# Patient Record
Sex: Female | Born: 1980 | Race: Black or African American | Hispanic: No | Marital: Married
Health system: Southern US, Community
[De-identification: ages and names within clinical notes are randomized; demographics above are authoritative.]

## PROBLEM LIST (undated history)

## (undated) ENCOUNTER — Emergency Department (HOSPITAL_COMMUNITY): Admission: EM | Payer: Medicaid Other

## (undated) DIAGNOSIS — Z789 Other specified health status: Secondary | ICD-10-CM

---

## 2021-07-01 ENCOUNTER — Ambulatory Visit (HOSPITAL_COMMUNITY)
Admission: EM | Admit: 2021-07-01 | Discharge: 2021-07-01 | Disposition: A | Discharge status: Home / Self Care | Payer: Medicaid Other | Attending: Family Medicine | Admitting: Family Medicine

## 2021-07-01 ENCOUNTER — Encounter (HOSPITAL_COMMUNITY): Payer: Self-pay

## 2021-07-01 ENCOUNTER — Other Ambulatory Visit: Payer: Self-pay

## 2021-07-01 DIAGNOSIS — Z3490 Encounter for supervision of normal pregnancy, unspecified, unspecified trimester: Secondary | ICD-10-CM

## 2021-07-01 DIAGNOSIS — Z3201 Encounter for pregnancy test, result positive: Secondary | ICD-10-CM

## 2021-07-01 LAB — POC URINE PREG, ED: Preg Test, Ur: POSITIVE — AB

## 2021-07-01 MED ORDER — PRENATAL PLUS VITAMIN/MINERAL 27-1 MG PO TABS: 1.0000 | ORAL_TABLET | Freq: Every day | ORAL | 0 refills | Status: DC

## 2021-07-01 NOTE — Discharge Instructions (Addendum)
Take a prenatal vitamin 1 daily.  Call to make an appointment with the [facility] doctor

## 2021-07-01 NOTE — ED Provider Notes (Signed)
[place]    CSN: [id number] Arrival date & time: 07/01/21  1856      History   Chief Complaint Chief Complaint  Patient presents with   Hypertension    HPI Janice Collins is a 41 y.o. female.    Hypertension  Here for a week history of feeling dizzy and a burning sensation in her head.  She has also had some nausea.  She feels that this is due to high blood pressure.  She was [place].  She is not having any headache.  Last menstrual period was November 2022.  History reviewed. No pertinent past medical history.  There are no problems to display for this patient.   Past Surgical History:  Procedure Laterality Date   CESAREAN SECTION      OB History     Gravida  1   Para      Term      Preterm      AB      Living         SAB      IAB      Ectopic      Multiple      Live Births               Home Medications    Prior to Admission medications   Medication Sig Start Date End Date Taking? Authorizing Provider  Prenatal Vit-Fe Fumarate-FA (PRENATAL PLUS VITAMIN/MINERAL) 27-1 MG TABS Take 1 tablet by mouth daily. 07/01/21  Yes Darral Rishel, Gwenlyn Perking, MD    Family History Family History  Family history unknown: Yes    Social History Social History   Tobacco Use   Smoking status: Never   Smokeless tobacco: Never     Allergies   Patient has no known allergies.   Review of Systems Review of Systems   Physical Exam Triage Vital Signs ED Triage Vitals  Enc Vitals Group     BP 07/01/21 2002 132/79     Pulse Rate 07/01/21 2002 74     Resp 07/01/21 2002 18     Temp 07/01/21 2002 98.8 F (37.1 C)     Temp Source 07/01/21 2002 Oral     SpO2 07/01/21 2002 99 %     Weight --      Height --      Head Circumference --      Peak Flow --      Pain Score 07/01/21 1957 5     Pain Loc --      Pain Edu? --      Excl. in [place]? --    No data found.  Updated Vital Signs BP  132/79 (BP Location: Left Arm)    Pulse 74    Temp 98.8 F (37.1 C) (Oral)    Resp 18    LMP 04/19/2021 Comment: EDD: 01/24/2022   SpO2 99%   Visual Acuity Right Eye Distance:   Left Eye Distance:   Bilateral Distance:    Right Eye Near:   Left Eye Near:    Bilateral Near:     Physical Exam Vitals reviewed.  Constitutional:      General: She is not in acute distress.    Appearance: She is not toxic-appearing.  HENT:     Mouth/Throat:     Mouth: Mucous membranes are moist.  Eyes:     Extraocular Movements: Extraocular movements intact.     Pupils: Pupils are equal,  round, and reactive to light.  Cardiovascular:     Rate and Rhythm: Normal rate and regular rhythm.     Heart sounds: No murmur heard. Pulmonary:     Effort: Pulmonary effort is normal.     Breath sounds: Normal breath sounds.  Musculoskeletal:     Cervical back: Neck supple.  Lymphadenopathy:     Cervical: No cervical adenopathy.  Skin:    Coloration: Skin is not jaundiced or pale.  Neurological:     General: No focal deficit present.     Mental Status: She is alert.  Psychiatric:        Behavior: Behavior normal.     UC Treatments / Results  Labs (all labs ordered are listed, but only abnormal results are displayed) Labs Reviewed  POC URINE PREG, ED - Abnormal; Notable for the following components:      Result Value   Preg Test, Ur POSITIVE (*)    All other components within normal limits    EKG   Radiology No results found.  Procedures Procedures (including critical care time)  Medications Ordered in UC Medications - No data to display  Initial Impression / Assessment and Plan / UC Course  I have reviewed the triage vital signs and the nursing notes.  Pertinent labs & imaging results that were available during my care of the patient were reviewed by me and considered in my medical decision making (see chart for details).     UPT is Positive. Her bp is normal. Discussed possibility  that these symptoms may be related to early pregnancy Final Clinical Impressions(s) / UC Diagnoses   Final diagnoses:  Early stage of pregnancy   Discharge Instructions   None    ED Prescriptions     Medication Sig Dispense Auth. Provider   Prenatal Vit-Fe Fumarate-FA (PRENATAL PLUS VITAMIN/MINERAL) 27-1 MG TABS Take 1 tablet by mouth daily. 30 tablet Azriel Jakob, Gwenlyn Perking, MD      PDMP not reviewed this encounter.   Barrett Henle, MD 07/01/21 2040

## 2021-07-01 NOTE — ED Triage Notes (Signed)
Pt presents with elevated blood pressure X 1 week with some dizziness and nausea; pt states she used to be on medication for it when she was in [place]

## 2021-08-09 ENCOUNTER — Other Ambulatory Visit: Payer: Self-pay

## 2021-08-09 ENCOUNTER — Inpatient Hospital Stay (HOSPITAL_COMMUNITY): Payer: Medicaid Other

## 2021-08-09 ENCOUNTER — Telehealth: Payer: Self-pay | Admitting: Family Medicine

## 2021-08-09 ENCOUNTER — Encounter (HOSPITAL_COMMUNITY): Payer: Self-pay

## 2021-08-09 ENCOUNTER — Inpatient Hospital Stay (HOSPITAL_COMMUNITY)
Admission: EM | Admit: 2021-08-09 | Discharge: 2021-08-09 | Disposition: A | Discharge status: Home / Self Care | Payer: Medicaid Other | Attending: Obstetrics and Gynecology | Admitting: Obstetrics and Gynecology

## 2021-08-09 DIAGNOSIS — O036 Delayed or excessive hemorrhage following complete or unspecified spontaneous abortion: Secondary | ICD-10-CM | POA: Insufficient documentation

## 2021-08-09 DIAGNOSIS — Z6740 Type O blood, Rh positive: Secondary | ICD-10-CM | POA: Diagnosis not present

## 2021-08-09 DIAGNOSIS — O209 Hemorrhage in early pregnancy, unspecified: Secondary | ICD-10-CM

## 2021-08-09 DIAGNOSIS — M549 Dorsalgia, unspecified: Secondary | ICD-10-CM | POA: Insufficient documentation

## 2021-08-09 DIAGNOSIS — O039 Complete or unspecified spontaneous abortion without complication: Secondary | ICD-10-CM | POA: Diagnosis not present

## 2021-08-09 DIAGNOSIS — O0932 Supervision of pregnancy with insufficient antenatal care, second trimester: Secondary | ICD-10-CM | POA: Diagnosis not present

## 2021-08-09 DIAGNOSIS — O26892 Other specified pregnancy related conditions, second trimester: Secondary | ICD-10-CM | POA: Diagnosis not present

## 2021-08-09 DIAGNOSIS — N939 Abnormal uterine and vaginal bleeding, unspecified: Secondary | ICD-10-CM | POA: Diagnosis not present

## 2021-08-09 DIAGNOSIS — R1030 Lower abdominal pain, unspecified: Secondary | ICD-10-CM | POA: Insufficient documentation

## 2021-08-09 DIAGNOSIS — O0942 Supervision of pregnancy with grand multiparity, second trimester: Secondary | ICD-10-CM | POA: Diagnosis not present

## 2021-08-09 DIAGNOSIS — O09522 Supervision of elderly multigravida, second trimester: Secondary | ICD-10-CM | POA: Diagnosis not present

## 2021-08-09 DIAGNOSIS — Z3A16 16 weeks gestation of pregnancy: Secondary | ICD-10-CM | POA: Diagnosis not present

## 2021-08-09 DIAGNOSIS — O26899 Other specified pregnancy related conditions, unspecified trimester: Secondary | ICD-10-CM

## 2021-08-09 HISTORY — DX: Other specified health status: Z78.9

## 2021-08-09 LAB — CBC WITH DIFFERENTIAL/PLATELET
Abs Immature Granulocytes: 0 10*3/uL (ref 0.00–0.07)
Basophils Absolute: 0 10*3/uL (ref 0.0–0.1)
Basophils Relative: 0 %
Eosinophils Absolute: 0.1 10*3/uL (ref 0.0–0.5)
Eosinophils Relative: 3 %
HCT: 33 % — ABNORMAL LOW (ref 36.0–46.0)
Hemoglobin: 10.3 g/dL — ABNORMAL LOW (ref 12.0–15.0)
Immature Granulocytes: 0 %
Lymphocytes Relative: 47 %
Lymphs Abs: 2 10*3/uL (ref 0.7–4.0)
MCH: 26.1 pg (ref 26.0–34.0)
MCHC: 31.2 g/dL (ref 30.0–36.0)
MCV: 83.5 fL (ref 80.0–100.0)
Monocytes Absolute: 0.3 10*3/uL (ref 0.1–1.0)
Monocytes Relative: 8 %
Neutro Abs: 1.7 10*3/uL (ref 1.7–7.7)
Neutrophils Relative %: 42 %
Platelets: 199 10*3/uL (ref 150–400)
RBC: 3.95 MIL/uL (ref 3.87–5.11)
RDW: 13.2 % (ref 11.5–15.5)
WBC: 4.1 10*3/uL (ref 4.0–10.5)
nRBC: 0 % (ref 0.0–0.2)

## 2021-08-09 LAB — WET PREP, GENITAL
Clue Cells Wet Prep HPF POC: NONE SEEN
Sperm: NONE SEEN
Trich, Wet Prep: NONE SEEN
WBC, Wet Prep HPF POC: <10 (ref <10)

## 2021-08-09 LAB — URINALYSIS, ROUTINE W REFLEX MICROSCOPIC
Bacteria, UA: NONE SEEN
Bilirubin Urine: NEGATIVE
Glucose, UA: NEGATIVE mg/dL
Ketones, ur: NEGATIVE mg/dL
Leukocytes,Ua: NEGATIVE
Nitrite: NEGATIVE
Protein, ur: NEGATIVE mg/dL
Specific Gravity, Urine: 1.012 (ref 1.005–1.030)
pH: 8 (ref 5.0–8.0)

## 2021-08-09 LAB — COMPREHENSIVE METABOLIC PANEL
ALT: 12 U/L (ref 0–44)
AST: 18 U/L (ref 15–41)
Albumin: 4.3 g/dL (ref 3.5–5.0)
Alkaline Phosphatase: 60 U/L (ref 38–126)
Anion gap: 8 (ref 5–15)
BUN: 8 mg/dL (ref 6–20)
CO2: 24 mmol/L (ref 22–32)
Calcium: 9.3 mg/dL (ref 8.9–10.3)
Chloride: 104 mmol/L (ref 98–111)
Creatinine, Ser: 0.48 mg/dL (ref 0.44–1.00)
GFR, Estimated: >60 mL/min (ref >60)
Glucose, Bld: 103 mg/dL — ABNORMAL HIGH (ref 70–99)
Potassium: 3.7 mmol/L (ref 3.5–5.1)
Sodium: 136 mmol/L (ref 135–145)
Total Bilirubin: 0.6 mg/dL (ref 0.3–1.2)
Total Protein: 8.3 g/dL — ABNORMAL HIGH (ref 6.5–8.1)

## 2021-08-09 LAB — GC/CHLAMYDIA PROBE AMP (~~LOC~~) NOT AT ARMC
Chlamydia: NEGATIVE
Comment: NEGATIVE
Comment: NORMAL
Neisseria Gonorrhea: NEGATIVE

## 2021-08-09 LAB — HCG, QUANTITATIVE, PREGNANCY: hCG, Beta Chain, Quant, S: 14834 m[IU]/mL — ABNORMAL HIGH (ref <5)

## 2021-08-09 LAB — ABO/RH: ABO/RH(D): O POS

## 2021-08-09 MED ORDER — ACETAMINOPHEN 500 MG PO TABS
1000.0000 mg | ORAL_TABLET | Freq: Once | ORAL | Status: AC
  Administered 2021-08-09: 1000 mg via ORAL
  Filled 2021-08-09: qty 2

## 2021-08-09 MED ORDER — TERCONAZOLE 0.4 % VA CREA: 1.0000 | TOPICAL_CREAM | Freq: Every day | VAGINAL | 0 refills | Status: DC

## 2021-08-09 NOTE — Telephone Encounter (Signed)
Called patient to inform of appointment in 2 weeks with the help of an interpreter, there was no answer to the phone call and the option to leave a voicemail was no available. A letter was mailed out to the patient.

## 2021-08-09 NOTE — ED Triage Notes (Signed)
Pt is G9P8, 3 months pregnant and began to have vaginal bleeding today, does not have an OB doctor here, recently moved here 4 months ago

## 2021-08-09 NOTE — MAU Provider Note (Signed)
[place]     CSN: [id number]  Arrival date and time: 08/09/21 0030   None     Chief Complaint  Patient presents with   Vaginal Bleeding   HPI Janice Collins is a 41 y.o G9P8 at [redacted]w[redacted]d by LMP who presents to MAU for vaginal bleeding, lower abdominal and back pain. Patient reports around 11pm tonight while cooking she had a small gush of bright red blood. Since then, she has not had any more bleeding. She denies passing clots or tissue. Shortly after the bleeding occurred she started having lower abdominal and back pain. She reports pain is intermittent and mild. She has not tried anything to relieve the pain. She denies urinary s/s, nausea, vomiting, diarrhea, or constipation. Sure LMP was 05/03/2021. She has not yet established prenatal care.   OB History     Gravida  9   Para  8   Term  8   Preterm      AB      Living  8      SAB      IAB      Ectopic      Multiple      Live Births  8           Past Medical History:  Diagnosis Date   Medical history non-contributory     Past Surgical History:  Procedure Laterality Date   CESAREAN SECTION      Family History  Family history unknown: Yes    Social History   Tobacco Use   Smoking status: Never   Smokeless tobacco: Never  Substance Use Topics   Alcohol use: Never   Drug use: Never    Allergies: No Known Allergies  Medications Prior to Admission  Medication Sig Dispense Refill Last Dose   Prenatal Vit-Fe Fumarate-FA (PRENATAL PLUS VITAMIN/MINERAL) 27-1 MG TABS Take 1 tablet by mouth daily. 30 tablet 0 08/09/2021    Review of Systems  Constitutional: Negative.   Respiratory: Negative.    Cardiovascular: Negative.   Gastrointestinal:  Positive for abdominal pain. Negative for constipation, diarrhea, nausea and vomiting.  Genitourinary:  Positive for vaginal bleeding. Negative for dysuria and frequency.  Musculoskeletal:  Positive for back pain.  Neurological: Negative.    Physical  Exam   Blood pressure 122/65, pulse 65, temperature 99.4 F (37.4 C), temperature source Oral, resp. rate 16, height 5\' 5"  (1.651 m), weight 72.1 kg, last menstrual period 04/19/2021, SpO2 100 %.  Physical Exam Vitals and nursing note reviewed. Exam conducted with a chaperone present.  Constitutional:      General: She is not in acute distress. Eyes:     Extraocular Movements: Extraocular movements intact.     Pupils: Pupils are equal, round, and reactive to light.  Cardiovascular:     Rate and Rhythm: Normal rate.  Pulmonary:     Effort: Pulmonary effort is normal.  Abdominal:     Palpations: Abdomen is soft.     Tenderness: There is no abdominal tenderness.  Genitourinary:    Comments: Blind swabs collected. Small amount of pink discharge noted on swabs Musculoskeletal:        General: Normal range of motion.     Cervical back: Normal range of motion.  Skin:    General: Skin is warm and dry.  Neurological:     General: No focal deficit present.     Mental Status: She is alert and oriented to person, place, and time.  Psychiatric:  Mood and Affect: Mood normal.        Behavior: Behavior normal.        Thought Content: Thought content normal.        Judgment: Judgment normal.   US OB LESS THAN 14 WEEKS WITH OB TRANSVAGINAL  Result Date: 08/09/2021 CLINICAL DATA:  Bleeding. EXAM: OBSTETRIC <14 WK [place] AND TRANSVAGINAL OB US TECHNIQUE: Both transabdominal and transvaginal ultrasound examinations were performed for complete evaluation of the gestation as well as the maternal uterus, adnexal regions, and pelvic cul-de-sac. Transvaginal technique was performed to assess early pregnancy. COMPARISON:  None. FINDINGS: Intrauterine gestational sac: Single Yolk sac:  No Embryo:  Yes Cardiac Activity: No Heart Rate: None. CRL:  20.3 mm   8 w   4 d                  [place] EDC: 03/17/2022. Subchorionic hemorrhage:  None visualized. Maternal uterus/adnexae: The ovaries are not well seen on  exam. The uterus is retroverted. No free fluid in the pelvis. IMPRESSION: Single intrauterine pregnancy with estimated gestational age of [redacted] weeks 4 days. No cardiac activity or fetal heart rate is identified, suggesting failed early pregnancy. Correlation with beta HCG and follow-up ultrasound are recommended. Electronically Signed   By: Thornell Sartorius M.D.   On: 08/09/2021 03:06    MAU Course  Procedures CBC, CMP, HCG, ABO/RH UA, culture Wet prep, GC/CT [place] Tylenol PO  MDM Labs reassuring. Blood type O positive, Rhogam not indicated. Given CRL >18mm and no fetal heart rate detected, findings suggestive of failed pregnancy. Patient was counseled on R/B/A of expectant management vs medication management. Patient chooses expectant management saying "God will keep the baby inside and heal the baby". I explained to patient that given her LMP and gestation by ultrasound today and that there was no heart beat along with spotting and cramping today that this does meet criteria for failed pregnancy. Patient and sister verbalize understanding and patient. She desires expectant management and will follow up at [facility] in 1-2 weeks. Tylenol PO was given per patient request. Swahili interpretor used through entirety of visit.   Assessment and Plan  Vaginal bleeding affecting pregnancy Abdominal pain affecting pregnancy Miscarriage  - Discharge home in stable condition - Strict bleeding and return precautions reviewed at length with patient and her sister. Return to MAU sooner or as needed for worsening symptoms - Patient will follow up at [facility], CNM 08/09/2021, 4:34 AM

## 2021-08-09 NOTE — MAU Note (Incomplete)
Pt came over from Main ED to MAU. Using video interpreter for Swahili, pt states she came in due to legs swollen, belly pain, and back pain. Symptoms started at 2300.Was cooking at that time and noticed a small amt vag bleeding.

## 2021-08-09 NOTE — ED Notes (Signed)
Report called to MAU  

## 2021-08-09 NOTE — ED Provider Triage Note (Signed)
Emergency Medicine Provider OB Triage Evaluation Note  Janice Collins is a 41 y.o. female, G9P8, at Unknown gestation who presents to the emergency department with complaints of abdominal pain.  Reports she is 3 months pregnant, started having bleeding today.    Review of  Systems  Positive: vaginal bleeding Negative: vomiting  Physical Exam  BP 128/80 (BP Location: Right Arm)    Pulse 70    Temp 99.4 F (37.4 C) (Oral)    Resp 16    LMP 04/19/2021 Comment: EDD: 01/24/2022   SpO2 99%  General: Awake, no distress  HEENT: Atraumatic  Resp: Normal effort  Cardiac: Normal rate Abd: Nondistended, nontender  MSK: Moves all extremities without difficulty Neuro: Speech clear  Medical Decision Making  Pt evaluated for pregnancy concern and is stable for transfer to MAU. Pt is in agreement with plan for transfer.  12:48 AM Discussed with MAU APP, Danielle, who accepts patient in transfer.  Clinical Impression    Vaginal bleeding in pregnancy.     Garlon Hatchet, PA-C 08/09/21 [phone]

## 2021-08-10 LAB — CULTURE, OB URINE

## 2021-08-15 ENCOUNTER — Telehealth: Payer: Self-pay

## 2021-08-15 DIAGNOSIS — B3731 Acute candidiasis of vulva and vagina: Secondary | ICD-10-CM

## 2021-08-15 DIAGNOSIS — B379 Candidiasis, unspecified: Secondary | ICD-10-CM

## 2021-08-15 MED ORDER — MICONAZOLE NITRATE 2 % VA CREA: 1.0000 | TOPICAL_CREAM | Freq: Every day | VAGINAL | 0 refills | Status: DC

## 2021-08-25 ENCOUNTER — Other Ambulatory Visit: Payer: Self-pay

## 2021-08-25 ENCOUNTER — Ambulatory Visit (INDEPENDENT_AMBULATORY_CARE_PROVIDER_SITE_OTHER): Payer: Medicaid Other | Admitting: Certified Nurse Midwife

## 2021-08-25 ENCOUNTER — Encounter: Payer: Self-pay | Admitting: Certified Nurse Midwife

## 2021-08-25 VITALS — BP 131/85 | HR 67 | Ht 65.0 in | Wt 154.1 lb

## 2021-08-25 DIAGNOSIS — O039 Complete or unspecified spontaneous abortion without complication: Secondary | ICD-10-CM

## 2021-08-25 DIAGNOSIS — O034 Incomplete spontaneous abortion without complication: Secondary | ICD-10-CM | POA: Diagnosis not present

## 2021-08-25 MED ORDER — ONDANSETRON 4 MG PO TBDP: 4.0000 mg | ORAL_TABLET | Freq: Three times a day (TID) | ORAL | 0 refills | Status: DC | PRN

## 2021-08-25 MED ORDER — MISOPROSTOL 200 MCG PO TABS: ORAL_TABLET | ORAL | 1 refills | Status: DC

## 2021-08-25 MED ORDER — IBUPROFEN 600 MG PO TABS: 600.0000 mg | ORAL_TABLET | Freq: Four times a day (QID) | ORAL | 1 refills | Status: DC | PRN

## 2021-08-25 NOTE — Progress Notes (Signed)
? ?  Subjective:  ? ?Patient Name: Janice Collins, female   DOB: 07-11-1980, 41 y.o.  MRN: [id number] ? ?HPI ?Seen for follow up of SAB 2 weeks ago. Diagnosed in MAU on 08/09/21 due to embryo measuring [redacted]w[redacted]d without a heartbeat. At the time the pt asked for expectant management as she was not ready to accept that the pregnancy was not viable. Since then has not had any bleeding or cramping. Wants to know if the baby has regained a heartbeat yet.   ? ?Review of Systems ?Pertinent items noted in HPI and remainder of comprehensive ROS otherwise negative.  ?   ?Objective:  ? Physical Exam  ?Constitutional: She is oriented to person, place, and time. She appears well-developed and well-nourished.  ?Cardiovascular: Normal rate.  ?Abdominal: Soft. She exhibits no distension.  ?Neurological: She is alert and oriented to person, place, and time.  ?Skin: Skin is warm and dry.  ?Psychiatric: She has a normal mood and affect. Her behavior is normal. Judgment and thought content normal.  ? ?Pt informed that the ultrasound is considered a limited OB ultrasound and is not intended to be a complete ultrasound exam.  Patient also informed that the ultrasound is not being completed with the intent of assessing for fetal or placental anomalies or any pelvic abnormalities.  Explained that the purpose of today?s ultrasound is to assess for  viability.  Patient acknowledges the purpose of the exam and the limitations of the study.   ? ?U/S completed by Derinda Late, RN with CNM at bedside. Similarly sized embryo found without a heartbeat. Explained to pt that the embryo is the same size it was last week and the lack of heartbeat. Showed the lack of heartbeat to the pt on the screen. Provided condolences and answered questions about future fertility, etc. Pt expressed full understanding of the loss and requested the medication to complete the miscarriage. Had discussed the case with Dr. Harolyn Rutherford who agreed pt could be given cytotec.  Bleeding precautions given and discussed when she should present to MAU if needed. Will otherwise follow up in two weeks.  ? ?Swahili interpreter Judeth Porch) present via [employer] video for translation services. ?Assessment & Plan:  ?1. Incomplete abortion ?- 800mg  buccal cytotec prescribed to patient with one refill to be used if no bleeding in 24hrs. ?- 4mg  ondansetron   ?- 600mg  ibuprofen sent to pharmacy ? ?SAB follow up in two weeks, pt can come to MAU if bleeding too heavy or she feels she will faint. ? ?Gabriel Carina DNP, CNM  ?08/25/21  5:43 PM  ? ?

## 2021-09-07 NOTE — Progress Notes (Signed)
Opened in error

## 2021-09-08 ENCOUNTER — Ambulatory Visit (INDEPENDENT_AMBULATORY_CARE_PROVIDER_SITE_OTHER): Payer: Medicaid Other | Admitting: Family Medicine

## 2021-09-08 DIAGNOSIS — O034 Incomplete spontaneous abortion without complication: Secondary | ICD-10-CM

## 2021-09-08 DIAGNOSIS — Z91199 Patient's noncompliance with other medical treatment and regimen due to unspecified reason: Secondary | ICD-10-CM

## 2021-10-07 ENCOUNTER — Inpatient Hospital Stay (HOSPITAL_COMMUNITY): Payer: Medicaid Other

## 2021-10-07 ENCOUNTER — Encounter (HOSPITAL_COMMUNITY): Payer: Self-pay | Admitting: Obstetrics & Gynecology

## 2021-10-07 ENCOUNTER — Ambulatory Visit (HOSPITAL_COMMUNITY)
Admission: EM | Admit: 2021-10-07 | Discharge: 2021-10-07 | Disposition: A | Discharge status: Home / Self Care | Payer: Medicaid Other | Attending: Internal Medicine | Admitting: Internal Medicine

## 2021-10-07 ENCOUNTER — Encounter (HOSPITAL_COMMUNITY): Payer: Self-pay | Admitting: *Deleted

## 2021-10-07 ENCOUNTER — Other Ambulatory Visit: Payer: Self-pay

## 2021-10-07 ENCOUNTER — Inpatient Hospital Stay (HOSPITAL_COMMUNITY)
Admission: AD | Admit: 2021-10-07 | Discharge: 2021-10-07 | Disposition: A | Discharge status: Home / Self Care | Payer: Medicaid Other | Attending: Obstetrics & Gynecology | Admitting: Obstetrics & Gynecology

## 2021-10-07 DIAGNOSIS — O0941 Supervision of pregnancy with grand multiparity, first trimester: Secondary | ICD-10-CM | POA: Insufficient documentation

## 2021-10-07 DIAGNOSIS — O021 Missed abortion: Secondary | ICD-10-CM | POA: Diagnosis not present

## 2021-10-07 DIAGNOSIS — O09521 Supervision of elderly multigravida, first trimester: Secondary | ICD-10-CM | POA: Diagnosis not present

## 2021-10-07 DIAGNOSIS — O034 Incomplete spontaneous abortion without complication: Secondary | ICD-10-CM | POA: Diagnosis not present

## 2021-10-07 DIAGNOSIS — Z3201 Encounter for pregnancy test, result positive: Secondary | ICD-10-CM | POA: Diagnosis present

## 2021-10-07 DIAGNOSIS — R1030 Lower abdominal pain, unspecified: Secondary | ICD-10-CM | POA: Insufficient documentation

## 2021-10-07 LAB — COMPREHENSIVE METABOLIC PANEL
ALT: 12 U/L (ref 0–44)
AST: 17 U/L (ref 15–41)
Albumin: 4.5 g/dL (ref 3.5–5.0)
Alkaline Phosphatase: 60 U/L (ref 38–126)
Anion gap: 8 (ref 5–15)
BUN: 10 mg/dL (ref 6–20)
CO2: 25 mmol/L (ref 22–32)
Calcium: 9.4 mg/dL (ref 8.9–10.3)
Chloride: 104 mmol/L (ref 98–111)
Creatinine, Ser: 0.52 mg/dL (ref 0.44–1.00)
GFR, Estimated: >60 mL/min (ref >60)
Glucose, Bld: 98 mg/dL (ref 70–99)
Potassium: 3.2 mmol/L — ABNORMAL LOW (ref 3.5–5.1)
Sodium: 137 mmol/L (ref 135–145)
Total Bilirubin: 1.3 mg/dL — ABNORMAL HIGH (ref 0.3–1.2)
Total Protein: 8.4 g/dL — ABNORMAL HIGH (ref 6.5–8.1)

## 2021-10-07 LAB — POCT URINALYSIS DIPSTICK, ED / UC
Bilirubin Urine: NEGATIVE
Glucose, UA: NEGATIVE mg/dL
Hgb urine dipstick: NEGATIVE
Ketones, ur: NEGATIVE mg/dL
Leukocytes,Ua: NEGATIVE
Nitrite: NEGATIVE
Protein, ur: NEGATIVE mg/dL
Specific Gravity, Urine: 1.01 (ref 1.005–1.030)
Urobilinogen, UA: 0.2 mg/dL (ref 0.0–1.0)
pH: 6.5 (ref 5.0–8.0)

## 2021-10-07 LAB — CBC
HCT: 34.7 % — ABNORMAL LOW (ref 36.0–46.0)
Hemoglobin: 10.7 g/dL — ABNORMAL LOW (ref 12.0–15.0)
MCH: 26.1 pg (ref 26.0–34.0)
MCHC: 30.8 g/dL (ref 30.0–36.0)
MCV: 84.6 fL (ref 80.0–100.0)
Platelets: 190 10*3/uL (ref 150–400)
RBC: 4.1 MIL/uL (ref 3.87–5.11)
RDW: 12.6 % (ref 11.5–15.5)
WBC: 4.6 10*3/uL (ref 4.0–10.5)
nRBC: 0 % (ref 0.0–0.2)

## 2021-10-07 LAB — POC URINE PREG, ED: Preg Test, Ur: POSITIVE — AB

## 2021-10-07 LAB — HCG, QUANTITATIVE, PREGNANCY: hCG, Beta Chain, Quant, S: 613 m[IU]/mL — ABNORMAL HIGH (ref <5)

## 2021-10-07 MED ORDER — IBUPROFEN 600 MG PO TABS: 600.0000 mg | ORAL_TABLET | Freq: Four times a day (QID) | ORAL | 0 refills | Status: DC | PRN

## 2021-10-07 MED ORDER — MISOPROSTOL 200 MCG PO TABS: ORAL_TABLET | ORAL | 1 refills | Status: DC

## 2021-10-07 MED ORDER — IBUPROFEN 600 MG PO TABS
600.0000 mg | ORAL_TABLET | Freq: Four times a day (QID) | ORAL | 3 refills | Status: DC | PRN
Start: 2021-10-07 — End: 2022-02-02

## 2021-10-07 MED ORDER — FLUCONAZOLE 150 MG PO TABS: 150.0000 mg | ORAL_TABLET | Freq: Once | ORAL | 0 refills | Status: DC

## 2021-10-07 MED ORDER — HYDROCODONE-ACETAMINOPHEN 5-325 MG PO TABS: 2.0000 | ORAL_TABLET | ORAL | 0 refills | Status: AC | PRN

## 2021-10-07 MED ORDER — PROMETHAZINE HCL 25 MG PO TABS: 25.0000 mg | ORAL_TABLET | Freq: Four times a day (QID) | ORAL | 0 refills | Status: DC | PRN

## 2021-10-07 NOTE — MAU Note (Signed)
Sam Weinhold CNM in Family Rm to discuss test results and d/c plan with pt.  ?

## 2021-10-07 NOTE — Discharge Instructions (Addendum)
Please go to the maternal assessment unit for further evaluation ?Your urine pregnancy test came out positive so you may be pregnant ?Return to urgent care if you have any other concerns ?Start taking prenatal vitamins. ?

## 2021-10-07 NOTE — ED Triage Notes (Signed)
Pt reports she has a problem with her stomach. Pt reports burning inside stomach. Pt also reports dysuria. For one week. Info from [employer] . ?

## 2021-10-07 NOTE — MAU Provider Note (Signed)
?History  ? ?CSN: [id number] ? ?Arrival date and time: 10/07/21 1715 ? ? Event Date/Time  ? First Provider Initiated Contact with Patient 10/07/21 1949   ?  ? ?Chief Complaint  ?Patient presents with  ? Abdominal Pain  ? ?HPI ?Janice Collins is a 41 y.o. [id number] in early pregnancy who presents to [facility]U from [facility] for evaluation of abdominal pain x 4 days. Pain is generalized to her lower abdomen. Pain score is 7/10. She denies aggravating or alleviating factors. She has not taken medication for this complaint. Patient denies vaginal bleeding, abdominal tenderness, dysuria, fever or recent illness.  ? ?OB History   ? ? Gravida  ?9  ? Para  ?8  ? Term  ?8  ? Preterm  ?   ? AB  ?1  ? Living  ?8  ?  ? ? SAB  ?1  ? IAB  ?   ? Ectopic  ?   ? Multiple  ?   ? Live Births  ?8  ?   ?  ?  ? ? ?Past Medical History:  ?Diagnosis Date  ? Medical history non-contributory   ? ? ?Past Surgical History:  ?Procedure Laterality Date  ? CESAREAN SECTION    ? ? ?Family History  ?Family history unknown: Yes  ? ? ?Social History  ? ?Tobacco Use  ? Smoking status: Never  ? Smokeless tobacco: Never  ?Substance Use Topics  ? Alcohol use: Never  ? Drug use: Never  ? ? ?Allergies: No Known Allergies ? ?Medications Prior to Admission  ?Medication Sig Dispense Refill Last Dose  ? fluconazole (DIFLUCAN) 150 MG tablet Take 1 tablet (150 mg total) by mouth once for 1 dose. Take second dose 3 days after the first dose if symptoms are persistent 2 tablet 0   ? misoprostol (CYTOTEC) 200 MCG tablet Place four tablets in between your gums and cheeks (two tablets on each side) as instructed, let dissolve and then swallow 20 minutes later. Can repeat in 24 hours if no bleeding. 4 tablet 1   ? ondansetron (ZOFRAN-ODT) 4 MG disintegrating tablet Take 1 tablet (4 mg total) by mouth every 8 (eight) hours as needed for nausea or vomiting. 15 tablet 0   ? ? ?Review of Systems  ?Gastrointestinal:  Positive for abdominal pain.  ?All other systems reviewed  and are negative. ?Physical Exam  ? ?Blood pressure 130/67, pulse 77, temperature 99 ?F (37.2 ?C), temperature source Oral, resp. rate 19, height 5\' 5"  (1.651 m), weight 70 kg, SpO2 100 %, unknown if currently breastfeeding. ? ?Physical Exam ?Vitals and nursing note reviewed. Exam conducted with a chaperone present.  ?Constitutional:   ?   Appearance: She is well-developed.  ?Cardiovascular:  ?   Rate and Rhythm: Normal rate.  ?Pulmonary:  ?   Effort: Pulmonary effort is normal.  ?Neurological:  ?   Mental Status: She is alert.  ? ? ?MAU Course  ?Procedures ? ?MDM ?Orders Placed This Encounter  ?Procedures  ? Culture, OB Urine  ? OB Transvaginal  ? CBC  ? hCG, quantitative, pregnancy  ? Comprehensive metabolic panel  ? Discharge patient  ? ?Patient Vitals for the past 24 hrs: ? BP Temp Temp src Pulse Resp SpO2 Height Weight  ?10/07/21 1817 130/67 99 ?F (37.2 ?C) Oral 77 19 100 % 5\' 5"  (1.651 m) 70 kg  ? ?Results for orders placed or performed during the hospital encounter of 10/07/21 (from the past 24 hour(s))  ?CBC  Status: Abnormal  ? Collection Time: 10/07/21  6:30 PM  ?Result Value Ref Range  ? WBC 4.6 4.0 - 10.5 K/uL  ? RBC 4.10 3.87 - 5.11 MIL/uL  ? Hemoglobin 10.7 (L) 12.0 - 15.0 g/dL  ? HCT 34.7 (L) 36.0 - 46.0 %  ? MCV 84.6 80.0 - 100.0 fL  ? MCH 26.1 26.0 - 34.0 pg  ? MCHC 30.8 30.0 - 36.0 g/dL  ? RDW 12.6 11.5 - 15.5 %  ? Platelets 190 150 - 400 K/uL  ? nRBC 0.0 0.0 - 0.2 %  ?hCG, quantitative, pregnancy     Status: Abnormal  ? Collection Time: 10/07/21  6:30 PM  ?Result Value Ref Range  ? hCG, Beta Chain, Quant, S 613 (H) <5 mIU/mL  ?Comprehensive metabolic panel     Status: Abnormal  ? Collection Time: 10/07/21  6:30 PM  ?Result Value Ref Range  ? Sodium 137 135 - 145 mmol/L  ? Potassium 3.2 (L) 3.5 - 5.1 mmol/L  ? Chloride 104 98 - 111 mmol/L  ? CO2 25 22 - 32 mmol/L  ? Glucose, Bld 98 70 - 99 mg/dL  ? BUN 10 6 - 20 mg/dL  ? Creatinine, Ser 0.52 0.44 - 1.00 mg/dL  ? Calcium 9.4 8.9 - 10.3 mg/dL   ? Total Protein 8.4 (H) 6.5 - 8.1 g/dL  ? Albumin 4.5 3.5 - 5.0 g/dL  ? AST 17 15 - 41 U/L  ? ALT 12 0 - 44 U/L  ? Alkaline Phosphatase 60 38 - 126 U/L  ? Total Bilirubin 1.3 (H) 0.3 - 1.2 mg/dL  ? GFR, Estimated >60 >60 mL/min  ? Anion gap 8 5 - 15  ? ?US OB Transvaginal ? ?Result Date: 10/07/2021 ?CLINICAL DATA:  Abdominal pain x4 days. EXAM: TRANSVAGINAL OB ULTRASOUND TECHNIQUE: Transvaginal ultrasound was performed for complete evaluation of the gestation as well as the maternal uterus, adnexal regions, and pelvic cul-de-sac. COMPARISON:  August 09, 2021 FINDINGS: Intrauterine gestational sac: Single Yolk sac:  Not Visualized. Embryo:  Visualized. Cardiac Activity: Not Visualized. Heart Rate: N/A bpm CRL:   14.8 mm   7 w 5 d                  [facility] Subchorionic hemorrhage:  Moderate sized. Maternal uterus/adnexae: The right ovary is poorly visualized. The left ovary is visualized and measures 3.3 cm x 2.6 cm x 2.8 cm. A trace amount of pelvic free fluid is noted. IMPRESSION: Single intrauterine pregnancy (approximately 7 weeks and 5 days gestation by ultrasound evaluation), without cardiac activity. Findings meet definitive criteria for failed pregnancy (crown-rump length greater than or equal to 7 mm and no heartbeat). This follows SRU consensus guidelines: Diagnostic Criteria for Nonviable Pregnancy Early in the First Trimester. Macy Mis Engl J Med [phone];[id number]. Electronically Signed   By: Aram Candelahaddeus  Houston M.D.   On: 10/07/2021 19:44   ? ?Early Intrauterine Pregnancy Failure ? ?___  Documented intrauterine pregnancy failure less than or equal to [redacted] weeks gestation ? ?___  No serious current illness ? ?___  Baseline Hgb greater than or equal to 10g/dl ? ?___  Patient has easily accessible transportation to the hospital ? ?___  Clear preference ? ?___  Practitioner/physician deems patient reliable ? ?___  Counseling by practitioner or physician ? ?___  Patient education by RN ? ?___  Medication  dispensed ? ? ___   Cytotec 800 mcg   ? ?___  Ibuprofen 600 mg 1 tablet  by mouth every 6 hours as needed #30 ? ?___  Hydrocodone/acetaminophen 5/325 mg by mouth every 4 to 6 hours as needed ? ?___  Phenergan 12.5 mg by mouth every 4 hours as needed for nausea ? ? ?Follow-up ?--one week: repeat Quant hCG ?--two weeks: appointment with provider ? ?Meds ordered this encounter  ?Medications  ? misoprostol (CYTOTEC) 200 MCG tablet  ?  Sig: Place four tablets in between your gums and cheeks (two tablets on each side) as instructed  ?  Dispense:  4 tablet  ?  Refill:  1  ?  Order Specific Question:   Supervising Provider  ?  Answer:   Duane Lope H [[id number]]  ? HYDROcodone-acetaminophen (NORCO/VICODIN) 5-325 MG tablet  ?  Sig: Take 2 tablets by mouth every 4 (four) hours as needed for up to 2 days.  ?  Dispense:  6 tablet  ?  Refill:  0  ?  Order Specific Question:   Supervising Provider  ?  Answer:   Duane Lope H [[id number]]  ? ibuprofen (ADVIL) 600 MG tablet  ?  Sig: Take 1 tablet (600 mg total) by mouth every 6 (six) hours as needed.  ?  Dispense:  60 tablet  ?  Refill:  3  ?  Order Specific Question:   Supervising Provider  ?  Answer:   Duane Lope H [[id number]]  ? promethazine (PHENERGAN) 25 MG tablet  ?  Sig: Take 1 tablet (25 mg total) by mouth every 6 (six) hours as needed for nausea or vomiting.  ?  Dispense:  30 tablet  ?  Refill:  0  ?  Order Specific Question:   Supervising Provider  ?  Answer:   Duane Lope H [[id number]]  ? ?Assessment and Plan  ?--41 y.o. [id number] with missed ab ?--Cytotec, home administration per patient preference ?--Hgb 10.7, Rh + ?--Language barrier: Kinyarwanda interpreter utilized for all patient interaction ?--Discharge home in stable condition ? ?F/U: ?Patient to have non-stat Quant hCG at [facility] in one week ? ?Calvert Cantor, CNM ?10/07/2021, 8:33 PM  ?

## 2021-10-07 NOTE — MAU Note (Signed)
...  Janice Collins is a 41 y.o. at [redacted]w[redacted]d here in MAU reporting: Vaginal and lower abdominal pain for the past four days. She reports she experiences the pain in her vagina during intercourse and the abdominal pain is intermittent and she also experiences this pain after intercourse. She is also reporting a lack of appetite. Denies vaginal bleeding. ? ?U/S in epic performed on 08/09/2021 revealed no cardiac activity. ? ?Pain score:  ?7/10 lower abdomen ? ?

## 2021-10-07 NOTE — ED Provider Notes (Signed)
?[facility] ? ? ? ?CSN: [id number] ?Arrival date & time: 10/07/21  1319 ? ? ?  ? ?History   ?Chief Complaint ?Chief Complaint  ?Patient presents with  ? Abdominal Pain  ? ? ?HPI ?Janice Collins is a 41 y.o. female comes to the urgent care with a 1 week history of vaginal and Lower abdominal pain.  Pain started after she had sexual intercourse with her husband.  Pain is currently 6 out of 10, constant.  No nausea or vomiting.  Patient recently had a miscarriage on September 06, 2021.  She denies any fever or chills.  She endorses dysuria with no urgency or frequency.  Sexual intercourse was painful both superficial and deep dyspareunia.  No flank pain or bloody stools.  Patient has some whitish to brown vaginal discharge.  It was associated with some itching in the vaginal area.  She denies any rash in the vaginal area.  No nausea or vomiting.  No abdominal distention or bloating. ? ?HPI ? ?Past Medical History:  ?Diagnosis Date  ? Medical history non-contributory   ? ? ?Patient Active Problem List  ? Diagnosis Date Noted  ? Incomplete miscarriage 08/25/2021  ? ? ?Past Surgical History:  ?Procedure Laterality Date  ? CESAREAN SECTION    ? ? ?OB History   ? ? Gravida  ?9  ? Para  ?8  ? Term  ?8  ? Preterm  ?   ? AB  ?   ? Living  ?8  ?  ? ? SAB  ?   ? IAB  ?   ? Ectopic  ?   ? Multiple  ?   ? Live Births  ?8  ?   ?  ?  ? ? ? ?Home Medications   ? ?Prior to Admission medications   ?Medication Sig Start Date End Date Taking? Authorizing Provider  ?fluconazole (DIFLUCAN) 150 MG tablet Take 1 tablet (150 mg total) by mouth once for 1 dose. Take second dose 3 days after the first dose if symptoms are persistent 10/07/21 10/07/21 Yes Jasminne Mealy, Britta MccreedyPhilip O, MD  ?misoprostol (CYTOTEC) 200 MCG tablet Place four tablets in between your gums and cheeks (two tablets on each side) as instructed, let dissolve and then swallow 20 minutes later. Can repeat in 24 hours if no bleeding. 08/25/21   Bernerd LimboWalker, Jamilla R, CNM  ?ondansetron  (ZOFRAN-ODT) 4 MG disintegrating tablet Take 1 tablet (4 mg total) by mouth every 8 (eight) hours as needed for nausea or vomiting. 08/25/21   Bernerd LimboWalker, Jamilla R, CNM  ? ? ?Family History ?Family History  ?Family history unknown: Yes  ? ? ?Social History ?Social History  ? ?Tobacco Use  ? Smoking status: Never  ? Smokeless tobacco: Never  ?Substance Use Topics  ? Alcohol use: Never  ? Drug use: Never  ? ? ? ?Allergies   ?Patient has no known allergies. ? ? ?Review of Systems ?Review of Systems ?As per HPI ? ?Physical Exam ?Triage Vital Signs ?ED Triage Vitals  ?Enc Vitals Group  ?   BP 10/07/21 1512 115/64  ?   Pulse Rate 10/07/21 1512 (!) 58  ?   Resp 10/07/21 1512 16  ?   Temp 10/07/21 1512 98.9 ?F (37.2 ?C)  ?   Temp src --   ?   SpO2 10/07/21 1512 100 %  ?   Weight --   ?   Height --   ?   Head Circumference --   ?  Peak Flow --   ?   Pain Score 10/07/21 1517 6  ?   Pain Loc --   ?   Pain Edu? --   ?   Excl. in GC? --   ? ?No data found. ? ?Updated Vital Signs ?BP 115/64   Pulse (!) 58   Temp 98.9 ?F (37.2 ?C)   Resp 16   LMP 04/19/2021 Comment: EDD: 01/24/2022  SpO2 100%  ? ?Visual Acuity ?Right Eye Distance:   ?Left Eye Distance:   ?Bilateral Distance:   ? ?Right Eye Near:   ?Left Eye Near:    ?Bilateral Near:    ? ?Physical Exam ?Vitals and nursing note reviewed. Exam conducted with a chaperone present.  ?Constitutional:   ?   General: She is not in acute distress. ?   Appearance: She is not ill-appearing.  ?Abdominal:  ?   General: Bowel sounds are increased.  ?   Palpations: Abdomen is soft. There is no shifting dullness, hepatomegaly or splenomegaly.  ?   Tenderness: There is abdominal tenderness in the suprapubic area. Negative signs include Murphy's sign, Rovsing's sign and McBurney's sign.  ?Neurological:  ?   Mental Status: She is alert.  ? ? ? ?UC Treatments / Results  ?Labs ?(all labs ordered are listed, but only abnormal results are displayed) ?Labs Reviewed  ?POC URINE PREG, ED - Abnormal;  Notable for the following components:  ?    Result Value  ? Preg Test, Ur POSITIVE (*)   ? All other components within normal limits  ?POCT URINALYSIS DIPSTICK, ED / UC  ?CERVICOVAGINAL ANCILLARY ONLY  ?CERVICOVAGINAL ANCILLARY ONLY  ? ? ?EKG ? ? ?Radiology ?No results found. ? ?Procedures ?Procedures (including critical care time) ? ?Medications Ordered in UC ?Medications - No data to display ? ?Initial Impression / Assessment and Plan / UC Course  ?I have reviewed the triage vital signs and the nursing notes. ? ?Pertinent labs & imaging results that were available during my care of the patient were reviewed by me and considered in my medical decision making (see chart for details). ? ?  ? ?1.  Positive pregnancy test with lower abdominal pain: ?Patient is advised to go to maternity assessment unit for further evaluation ?She recently had a miscarriage about a month ago and I expect her beta-hCG qualitative to be negative.  Patient was lost to follow-up and I cannot confirm if she actually took the medications prescribed during the spontaneous abortion encounter.  Patient will need an ultrasound and quantitative hCG to evaluate if this is related to the recent spontaneous incomplete abortion or if this is a new pregnancy.  Patient verbalized understanding of the plan. ?Final Clinical Impressions(s) / UC Diagnoses  ? ?Final diagnoses:  ?Positive pregnancy test  ?Lower abdominal pain  ? ? ? ?Discharge Instructions   ? ?  ?Please go to the maternal assessment unit for further evaluation ?Your urine pregnancy test came out positive so you may be pregnant ?Return to urgent care if you have any other concerns ?Start taking prenatal vitamins. ? ? ? ? ?ED Prescriptions   ? ? Medication Sig Dispense Auth. Provider  ? fluconazole (DIFLUCAN) 150 MG tablet Take 1 tablet (150 mg total) by mouth once for 1 dose. Take second dose 3 days after the first dose if symptoms are persistent 2 tablet Cheo Selvey, Britta Mccreedy, MD  ? ibuprofen  (ADVIL) 600 MG tablet  (Status: Discontinued) Take 1 tablet (600 mg total) by mouth every 6 (  six) hours as needed. 30 tablet Aneesah Hernan, Britta Mccreedy, MD  ? ?  ? ?PDMP not reviewed this encounter. ?  ?Merrilee Jansky, MD ?10/07/21 1734 ? ?

## 2021-10-07 NOTE — ED Notes (Signed)
Pt unable to provide urine sample at this time 

## 2021-10-09 ENCOUNTER — Inpatient Hospital Stay (HOSPITAL_COMMUNITY)
Admission: AD | Admit: 2021-10-09 | Discharge: 2021-10-09 | Disposition: A | Discharge status: Home / Self Care | Payer: Medicaid Other | Attending: Family Medicine | Admitting: Family Medicine

## 2021-10-09 DIAGNOSIS — O09521 Supervision of elderly multigravida, first trimester: Secondary | ICD-10-CM | POA: Insufficient documentation

## 2021-10-09 DIAGNOSIS — O034 Incomplete spontaneous abortion without complication: Secondary | ICD-10-CM | POA: Insufficient documentation

## 2021-10-09 DIAGNOSIS — Z711 Person with feared health complaint in whom no diagnosis is made: Secondary | ICD-10-CM | POA: Insufficient documentation

## 2021-10-09 DIAGNOSIS — O021 Missed abortion: Secondary | ICD-10-CM | POA: Diagnosis not present

## 2021-10-09 DIAGNOSIS — Z3A01 Less than 8 weeks gestation of pregnancy: Secondary | ICD-10-CM | POA: Insufficient documentation

## 2021-10-09 LAB — CULTURE, OB URINE: Culture: NO GROWTH

## 2021-10-09 NOTE — MAU Note (Signed)
.  Janice Collins is a 41 y.o. at Unknown here in MAU reporting: she was sent home with RX to induce delivery of 7 week pregnancy shown to have no heart rate. Pt reports she took misoprostil 842mcg at 1500 and hasnt had any results. Reports uterine cramping. Denies vaginal bleeding or passage of tissue of POC.  ? ?Verbal interpreter via [employer]. ? ?Pain score: 6 ?Vitals:  ? 10/09/21 0330  ?BP: 128/74  ?Pulse: 73  ?Resp: 17  ?Temp: 98.7 ?F (37.1 ?C)  ?SpO2: 100%  ?   ? ? ? ?

## 2021-10-09 NOTE — MAU Provider Note (Signed)
?History  ?  ? ?CSN: [id number] ? ?Arrival date and time: 10/09/21 0256 ? ? Event Date/Time  ? First Provider Initiated Contact with Patient 10/09/21 0344   ?  ? ?Chief Complaint  ?Patient presents with  ? Medication Reaction  ?  Given medication to induce delivery of 7 week pregnancy  ? ?HPI ? ?Janice Collins is a 41 y.o. female [phone] here with concerns about cytotec and nothing happing after taking it. She was diagnosed with a missed AB@ 7 weeks on 4/28. She was given cytotec and took this yesterday @ 1500. She has started to cramp however has not had any bleeding. She is worried nothing has started happening.  ? ?OB History   ? ? Gravida  ?9  ? Para  ?8  ? Term  ?8  ? Preterm  ?   ? AB  ?1  ? Living  ?8  ?  ? ? SAB  ?1  ? IAB  ?   ? Ectopic  ?   ? Multiple  ?   ? Live Births  ?8  ?   ?  ?  ? ? ?Past Medical History:  ?Diagnosis Date  ? Medical history non-contributory   ? ? ?Past Surgical History:  ?Procedure Laterality Date  ? CESAREAN SECTION    ? ? ?Family History  ?Family history unknown: Yes  ? ? ?Social History  ? ?Tobacco Use  ? Smoking status: Never  ? Smokeless tobacco: Never  ?Substance Use Topics  ? Alcohol use: Never  ? Drug use: Never  ? ? ?Allergies: No Known Allergies ? ?Medications Prior to Admission  ?Medication Sig Dispense Refill Last Dose  ? HYDROcodone-acetaminophen (NORCO/VICODIN) 5-325 MG tablet Take 2 tablets by mouth every 4 (four) hours as needed for up to 2 days. 6 tablet 0   ? ibuprofen (ADVIL) 600 MG tablet Take 1 tablet (600 mg total) by mouth every 6 (six) hours as needed. 60 tablet 3   ? misoprostol (CYTOTEC) 200 MCG tablet Place four tablets in between your gums and cheeks (two tablets on each side) as instructed 4 tablet 1   ? promethazine (PHENERGAN) 25 MG tablet Take 1 tablet (25 mg total) by mouth every 6 (six) hours as needed for nausea or vomiting. 30 tablet 0   ? ?Results for orders placed or performed during the hospital encounter of 10/07/21 (from the past 48  hour(s))  ?Culture, OB Urine     Status: None  ? Collection Time: 10/07/21  6:07 PM  ? Specimen: Urine, Random  ?Result Value Ref Range  ? Specimen Description URINE, RANDOM   ? Special Requests NONE   ? Culture    ?  NO GROWTH ?Performed at [facility], [address] [place], [place] [address] ?  ? Report Status 10/09/2021 FINAL   ?CBC     Status: Abnormal  ? Collection Time: 10/07/21  6:30 PM  ?Result Value Ref Range  ? WBC 4.6 4.0 - 10.5 K/uL  ? RBC 4.10 3.87 - 5.11 MIL/uL  ? Hemoglobin 10.7 (L) 12.0 - 15.0 g/dL  ? HCT 34.7 (L) 36.0 - 46.0 %  ? MCV 84.6 80.0 - 100.0 fL  ? MCH 26.1 26.0 - 34.0 pg  ? MCHC 30.8 30.0 - 36.0 g/dL  ? RDW 12.6 11.5 - 15.5 %  ? Platelets 190 150 - 400 K/uL  ? nRBC 0.0 0.0 - 0.2 %  ?  Comment: Performed at [facility], [address] [place], [place] [address]  ?hCG, quantitative, pregnancy     Status: Abnormal  ? Collection Time: 10/07/21  6:30 PM  ?Result Value Ref Range  ? hCG, Beta Chain, Quant, S 613 (H) <5 mIU/mL  ?  Comment:        ?  GEST. AGE      CONC.  (mIU/mL) ?  <=1 WEEK        5 - 50 ?    2 WEEKS       50 - 500 ?    3 WEEKS       100 - 10,000 ?    4 WEEKS     1,000 - 30,000 ?    5 WEEKS     3,500 - 115,000 ?  6-8 WEEKS     12,000 - 270,000 ?   12 WEEKS     15,000 - 220,000 ?       ?FEMALE AND NON-PREGNANT FEMALE: ?    LESS THAN 5 mIU/mL ?Performed at [facility], [address] [place], [place] [address] ?  ?Comprehensive metabolic panel     Status: Abnormal  ? Collection Time: 10/07/21  6:30 PM  ?Result Value Ref Range  ? Sodium 137 135 - 145 mmol/L  ? Potassium 3.2 (L) 3.5 - 5.1 mmol/L  ? Chloride 104 98 - 111 mmol/L  ? CO2 25 22 - 32 mmol/L  ? Glucose, Bld 98 70 - 99 mg/dL  ?  Comment: Glucose reference range applies only to samples taken after fasting for at least 8 hours.  ? BUN 10 6 - 20 mg/dL  ? Creatinine, Ser 0.52 0.44 - 1.00 mg/dL  ? Calcium 9.4 8.9 - 10.3 mg/dL  ? Total Protein 8.4 (H) 6.5 - 8.1 g/dL  ? Albumin 4.5 3.5 - 5.0 g/dL  ? AST 17  15 - 41 U/L  ? ALT 12 0 - 44 U/L  ? Alkaline Phosphatase 60 38 - 126 U/L  ? Total Bilirubin 1.3 (H) 0.3 - 1.2 mg/dL  ? GFR, Estimated >60 >60 mL/min  ?  Comment: (NOTE) ?Calculated using the CKD-EPI Creatinine Equation (2021) ?  ? Anion gap 8 5 - 15  ?  Comment: Performed at [facility], [address] [place], [place] [address]  ?  ?Review of Systems  ?Gastrointestinal:  Positive for abdominal pain.  ?Genitourinary:  Negative for vaginal bleeding.  ?Physical Exam  ? ?Blood pressure 128/74, pulse 73, temperature 98.7 ?F (37.1 ?C), temperature source Oral, resp. rate 17, height 5\' 5"  (1.651 m), weight 70.3 kg, SpO2 100 %, unknown if currently breastfeeding. ? ?Physical Exam ?Constitutional:   ?   General: She is not in acute distress. ?   Appearance: Normal appearance. She is not ill-appearing, toxic-appearing or diaphoretic.  ?Neurological:  ?   Mental Status: She is alert and oriented to person, place, and time.  ?Psychiatric:     ?   Mood and Affect: Mood normal.  ? ?MAU Course  ?Procedures ?None ? ?MDM ? ? ? ?Assessment and Plan  ? ?A: ? ?1. Physically well but worried   ?2. Incomplete miscarriage   ?  ? ?P: ? ?DC home ?Discussed onset and timing of cytotec ?Message sent to [facility] for follow up for 1 week for Hcg and 2 weeks with provider.  ? ?SEMPERVIRENS P.H[facility] NP ?10/09/2021 ?11:41 AM ? ?

## 2021-10-09 NOTE — MAU Note (Signed)
Pt stated she understood discharge instructions given by provider. [employer] interpreter used for all information. ?

## 2021-10-10 ENCOUNTER — Other Ambulatory Visit: Payer: Self-pay

## 2021-10-10 ENCOUNTER — Inpatient Hospital Stay (HOSPITAL_COMMUNITY)
Admission: AD | Admit: 2021-10-10 | Discharge: 2021-10-10 | Disposition: A | Discharge status: Home / Self Care | Payer: Medicaid Other | Attending: Obstetrics and Gynecology | Admitting: Obstetrics and Gynecology

## 2021-10-10 DIAGNOSIS — O021 Missed abortion: Secondary | ICD-10-CM | POA: Insufficient documentation

## 2021-10-10 DIAGNOSIS — O034 Incomplete spontaneous abortion without complication: Secondary | ICD-10-CM

## 2021-10-10 NOTE — MAU Note (Addendum)
.  Janice Collins is a 41 y.o. here in MAU reporting: took cytotec on Friday for incomplete miscarriage. She is concerned because she has not had any bleeding since she has taken the medication. Was seen in MAU for the same concerns yesterday. Denies current pain.  ? ?Pain score: 0 ?

## 2021-10-10 NOTE — MAU Provider Note (Signed)
?History  ?  ? ?CSN: [id number] ? ?Arrival date and time: 10/10/21 1729 ? ? Event Date/Time  ? First Provider Initiated Contact with Patient 10/10/21 1744   ?  ? ?No chief complaint on file. ? ?41 year old G9 P8-0-1-8 with known missed AB presenting with concerns about medical management.  She was seen 3 days ago in MAU, missed AB diagnosed, and chose medical management.  States she took Cytotec 2 days ago has had no spotting, bleeding, or cramping.  She is concerned that the medication did not work. ? ? ?OB History   ? ? Gravida  ?9  ? Para  ?8  ? Term  ?8  ? Preterm  ?   ? AB  ?1  ? Living  ?8  ?  ? ? SAB  ?1  ? IAB  ?   ? Ectopic  ?   ? Multiple  ?   ? Live Births  ?8  ?   ?  ?  ? ? ?Past Medical History:  ?Diagnosis Date  ? Medical history non-contributory   ? ? ?Past Surgical History:  ?Procedure Laterality Date  ? CESAREAN SECTION    ? ? ?Family History  ?Family history unknown: Yes  ? ? ?Social History  ? ?Tobacco Use  ? Smoking status: Never  ? Smokeless tobacco: Never  ?Substance Use Topics  ? Alcohol use: Never  ? Drug use: Never  ? ? ?Allergies: No Known Allergies ? ?No medications prior to admission.  ? ? ?Review of Systems  ?Gastrointestinal:  Negative for abdominal pain.  ?Genitourinary:  Negative for vaginal bleeding.  ?Physical Exam  ? ?Blood pressure 132/73, pulse 74, temperature 98.8 ?F (37.1 ?C), resp. rate 16, unknown if currently breastfeeding. ? ?Physical Exam ?Vitals and nursing note reviewed.  ?Constitutional:   ?   General: She is not in acute distress. ?   Appearance: Normal appearance.  ?HENT:  ?   Head: Normocephalic and atraumatic.  ?Pulmonary:  ?   Effort: Pulmonary effort is normal. No respiratory distress.  ?Musculoskeletal:     ?   General: Normal range of motion.  ?   Cervical back: Normal range of motion.  ?Neurological:  ?   General: No focal deficit present.  ?   Mental Status: She is alert and oriented to person, place, and time.  ?Psychiatric:     ?   Mood and Affect: Mood  normal.     ?   Behavior: Behavior normal.  ? ? ?MAU Course  ?Procedures ? ?MDM ?Informed patient that single dose may not work, recommend second dose to be taken p.o. and original Rx has 1 refill.  Recommend follow-up in clinic in 1 week. ?Stable for discharge. ? ?Assessment and Plan  ?MAB ?Discharge home ?Follow-up at [facility] in 1 week-message sent ?Return precautions ? ?Video interpreter used for encounter ? ?Allergies as of 10/10/2021   ?No Known Allergies ?  ? ?  ?Medication List  ?  ? ?TAKE these medications   ? ?ibuprofen 600 MG tablet ?Commonly known as: ADVIL ?Take 1 tablet (600 mg total) by mouth every 6 (six) hours as needed. ?  ?misoprostol 200 MCG tablet ?Commonly known as: CYTOTEC ?Place four tablets in between your gums and cheeks (two tablets on each side) as instructed ?  ?promethazine 25 MG tablet ?Commonly known as: PHENERGAN ?Take 1 tablet (25 mg total) by mouth every 6 (six) hours as needed for nausea or vomiting. ?  ? ?  ? ? ?  Donette Larry ?10/10/2021, 6:01 PM  ?

## 2021-10-11 LAB — CERVICOVAGINAL ANCILLARY ONLY
Bacterial Vaginitis (gardnerella): POSITIVE — AB
Candida Glabrata: NEGATIVE
Candida Vaginitis: NEGATIVE
Chlamydia: NEGATIVE
Comment: NEGATIVE
Comment: NEGATIVE
Comment: NEGATIVE
Comment: NEGATIVE
Comment: NEGATIVE
Comment: NORMAL
Neisseria Gonorrhea: NEGATIVE
Trichomonas: NEGATIVE

## 2021-10-17 ENCOUNTER — Other Ambulatory Visit: Payer: Self-pay | Admitting: *Deleted

## 2021-10-17 ENCOUNTER — Ambulatory Visit: Payer: Medicaid Other

## 2021-10-17 DIAGNOSIS — O021 Missed abortion: Secondary | ICD-10-CM

## 2021-10-18 LAB — BETA HCG QUANT (REF LAB): hCG Quant: 305 m[IU]/mL

## 2021-10-27 ENCOUNTER — Ambulatory Visit (INDEPENDENT_AMBULATORY_CARE_PROVIDER_SITE_OTHER): Payer: Medicaid Other

## 2021-10-27 ENCOUNTER — Telehealth: Payer: Self-pay | Admitting: Family Medicine

## 2021-10-27 VITALS — BP 138/79 | HR 79 | Wt 158.0 lb

## 2021-10-27 DIAGNOSIS — O039 Complete or unspecified spontaneous abortion without complication: Secondary | ICD-10-CM

## 2021-10-27 NOTE — Progress Notes (Signed)
   GYNECOLOGY PROGRESS NOTE  History:  41 y.o. [id number] presents to [place] office today for follow up for miscarriage. She reports she completed 2 rounds of Cytotec, last round was 2 weeks ago. Reports she bled only a small amount, but never passed anything. She "feels like something is moving in uterus". She denies pain or other worrisome symptoms.   The following portions of the patient's history were reviewed and updated as appropriate: allergies, current medications, past family history, past medical history, past social history, past surgical history and problem list. Last pap smear unknown.   Health Maintenance Due  Topic Date Due   HIV Screening  Never done   Hepatitis C Screening  Never done   TETANUS/TDAP  Never done   PAP SMEAR-Modifier  Never done     Review of Systems:  Pertinent items are noted in HPI.   Objective:  Physical Exam Blood pressure 138/79, pulse 79, weight 158 lb (71.7 kg), not currently breastfeeding. VS reviewed, nursing note reviewed,  Constitutional: well developed, well nourished, no distress HEENT: normocephalic CV: normal rate Pulm/chest wall: normal effort Breast Exam: deferred Abdomen: soft Neuro: alert and oriented x 3 Skin: warm, dry Psych: affect normal Pelvic exam: deferred  Assessment & Plan:  1. Miscarriage - Reviewed previous bHCG results which have been trending down. Last bHCG was 305 10 days ago. Will repeat today. She is frustrated that she has had to have multiple blood draws. She is requesting an ultrasound today. I reviewed with patient rationale behind trending bHCG levels. I reassured patient that given bHCG's trending downward, Cytotec likely worked. If there is concern with results of today's bHCG not trending down, may repeat ultrasound. Patient verbalizes understanding - In person [employer] interpretor present for entire visit   - Beta hCG quant (ref lab)   No follow-ups on file.   Renee Harder, CNM 12:35  PM

## 2021-10-27 NOTE — Telephone Encounter (Signed)
Called case worker. Explained I am not able to give any patient information without consent of patient. Explained patient may fill this out next time they are in office. Case worker is concerned patient does not understand all information regarding miscarriage discussed today at visit. Encouraged case worker to let patient know she may speak to [place] over the phone with an interpreter or she can schedule a follow up visit.

## 2021-10-27 NOTE — Telephone Encounter (Signed)
Patient's case worker has a question for clinical staff

## 2021-10-28 LAB — BETA HCG QUANT (REF LAB): hCG Quant: 156 m[IU]/mL

## 2021-11-03 ENCOUNTER — Telehealth: Payer: Self-pay | Admitting: Lactation Services

## 2021-11-03 NOTE — Telephone Encounter (Signed)
Called patient with assistance of [employer] telephone [employer]arwanda Interpreter, [phone] to inform patient that Beta Hcg is decreasing and needs follow up Beta Hcg in 2 weeks. Patient did not answer, LM for her to call the office for results.

## 2021-11-03 NOTE — Telephone Encounter (Signed)
-----   Message from Renee Harder, CNM sent at 10/29/2021 11:07 AM EDT ----- Can you please call and let patient know that her bHCG is trending down. She will also need to continue following bHCG levels until 0.   Thanks! Andee Poles, CNM

## 2021-11-04 ENCOUNTER — Other Ambulatory Visit: Payer: Self-pay

## 2021-11-04 ENCOUNTER — Emergency Department (HOSPITAL_COMMUNITY): Payer: Medicaid Other

## 2021-11-04 ENCOUNTER — Emergency Department (HOSPITAL_COMMUNITY)
Admission: EM | Admit: 2021-11-04 | Discharge: 2021-11-04 | Disposition: A | Discharge status: Home / Self Care | Payer: Medicaid Other | Attending: Emergency Medicine | Admitting: Emergency Medicine

## 2021-11-04 DIAGNOSIS — T59811A Toxic effect of smoke, accidental (unintentional), initial encounter: Secondary | ICD-10-CM | POA: Diagnosis not present

## 2021-11-04 DIAGNOSIS — Z79899 Other long term (current) drug therapy: Secondary | ICD-10-CM | POA: Insufficient documentation

## 2021-11-04 DIAGNOSIS — O26899 Other specified pregnancy related conditions, unspecified trimester: Secondary | ICD-10-CM | POA: Diagnosis present

## 2021-11-04 DIAGNOSIS — R7309 Other abnormal glucose: Secondary | ICD-10-CM | POA: Insufficient documentation

## 2021-11-04 DIAGNOSIS — N9489 Other specified conditions associated with female genital organs and menstrual cycle: Secondary | ICD-10-CM | POA: Insufficient documentation

## 2021-11-04 DIAGNOSIS — O034 Incomplete spontaneous abortion without complication: Secondary | ICD-10-CM

## 2021-11-04 LAB — CBC WITH DIFFERENTIAL/PLATELET
Abs Immature Granulocytes: 0.01 10*3/uL (ref 0.00–0.07)
Basophils Absolute: 0 10*3/uL (ref 0.0–0.1)
Basophils Relative: 1 %
Eosinophils Absolute: 0.2 10*3/uL (ref 0.0–0.5)
Eosinophils Relative: 4 %
HCT: 34.1 % — ABNORMAL LOW (ref 36.0–46.0)
Hemoglobin: 10.7 g/dL — ABNORMAL LOW (ref 12.0–15.0)
Immature Granulocytes: 0 %
Lymphocytes Relative: 43 %
Lymphs Abs: 2 10*3/uL (ref 0.7–4.0)
MCH: 27 pg (ref 26.0–34.0)
MCHC: 31.4 g/dL (ref 30.0–36.0)
MCV: 85.9 fL (ref 80.0–100.0)
Monocytes Absolute: 0.4 10*3/uL (ref 0.1–1.0)
Monocytes Relative: 9 %
Neutro Abs: 2 10*3/uL (ref 1.7–7.7)
Neutrophils Relative %: 43 %
Platelets: 192 10*3/uL (ref 150–400)
RBC: 3.97 MIL/uL (ref 3.87–5.11)
RDW: 12 % (ref 11.5–15.5)
WBC: 4.7 10*3/uL (ref 4.0–10.5)
nRBC: 0 % (ref 0.0–0.2)

## 2021-11-04 LAB — COOXEMETRY PANEL
Carboxyhemoglobin: 1.8 % — ABNORMAL HIGH (ref 0.5–1.5)
Methemoglobin: 1.5 % (ref 0.0–1.5)
O2 Saturation: 99.7 %
Total hemoglobin: 10.4 g/dL — ABNORMAL LOW (ref 12.0–16.0)

## 2021-11-04 LAB — URINALYSIS, ROUTINE W REFLEX MICROSCOPIC
Bilirubin Urine: NEGATIVE
Glucose, UA: NEGATIVE mg/dL
Ketones, ur: NEGATIVE mg/dL
Nitrite: NEGATIVE
Protein, ur: 30 mg/dL — AB
RBC / HPF: >50 RBC/hpf — ABNORMAL HIGH (ref 0–5)
Specific Gravity, Urine: 1.005 (ref 1.005–1.030)
pH: 9 — ABNORMAL HIGH (ref 5.0–8.0)

## 2021-11-04 LAB — COMPREHENSIVE METABOLIC PANEL
ALT: 9 U/L (ref 0–44)
AST: 16 U/L (ref 15–41)
Albumin: 4 g/dL (ref 3.5–5.0)
Alkaline Phosphatase: 63 U/L (ref 38–126)
Anion gap: 9 (ref 5–15)
BUN: 9 mg/dL (ref 6–20)
CO2: 23 mmol/L (ref 22–32)
Calcium: 8.8 mg/dL — ABNORMAL LOW (ref 8.9–10.3)
Chloride: 107 mmol/L (ref 98–111)
Creatinine, Ser: 0.56 mg/dL (ref 0.44–1.00)
GFR, Estimated: >60 mL/min (ref >60)
Glucose, Bld: 109 mg/dL — ABNORMAL HIGH (ref 70–99)
Potassium: 3.9 mmol/L (ref 3.5–5.1)
Sodium: 139 mmol/L (ref 135–145)
Total Bilirubin: 0.9 mg/dL (ref 0.3–1.2)
Total Protein: 7.7 g/dL (ref 6.5–8.1)

## 2021-11-04 LAB — ETHANOL: Alcohol, Ethyl (B): <10 mg/dL (ref <10)

## 2021-11-04 LAB — RAPID URINE DRUG SCREEN, HOSP PERFORMED
Amphetamines: NOT DETECTED
Barbiturates: NOT DETECTED
Benzodiazepines: NOT DETECTED
Cocaine: NOT DETECTED
Opiates: NOT DETECTED
Tetrahydrocannabinol: NOT DETECTED

## 2021-11-04 LAB — SALICYLATE LEVEL: Salicylate Lvl: <7 mg/dL — ABNORMAL LOW (ref 7.0–30.0)

## 2021-11-04 LAB — I-STAT BETA HCG BLOOD, ED (MC, WL, AP ONLY): I-stat hCG, quantitative: 59.9 m[IU]/mL — ABNORMAL HIGH (ref <5)

## 2021-11-04 LAB — ACETAMINOPHEN LEVEL: Acetaminophen (Tylenol), Serum: <10 ug/mL — ABNORMAL LOW (ref 10–30)

## 2021-11-04 LAB — CBG MONITORING, ED: Glucose-Capillary: 109 mg/dL — ABNORMAL HIGH (ref 70–99)

## 2021-11-04 MED ORDER — FERROUS SULFATE 325 (65 FE) MG PO TABS: 325.0000 mg | ORAL_TABLET | Freq: Every day | ORAL | 0 refills | Status: DC

## 2021-11-04 MED ORDER — ONDANSETRON 4 MG PO TBDP: 4.0000 mg | ORAL_TABLET | Freq: Three times a day (TID) | ORAL | 0 refills | Status: DC | PRN

## 2021-11-04 MED ORDER — IBUPROFEN 600 MG PO TABS: 600.0000 mg | ORAL_TABLET | Freq: Four times a day (QID) | ORAL | 0 refills | Status: DC | PRN

## 2021-11-04 MED ORDER — KETOROLAC TROMETHAMINE 30 MG/ML IJ SOLN
30.0000 mg | Freq: Once | INTRAMUSCULAR | Status: AC
Start: 2021-11-04 — End: 2021-11-04
  Administered 2021-11-04: 30 mg via INTRAVENOUS
  Filled 2021-11-04: qty 1

## 2021-11-04 MED ORDER — MISOPROSTOL 200 MCG PO TABS
800.0000 ug | ORAL_TABLET | Freq: Once | ORAL | Status: AC
  Administered 2021-11-04: 800 ug via VAGINAL
  Filled 2021-11-04: qty 4

## 2021-11-04 MED ORDER — SODIUM CHLORIDE 0.9 % IV BOLUS
1000.0000 mL | Freq: Once | INTRAVENOUS | Status: AC
  Administered 2021-11-04: 1000 mL via INTRAVENOUS
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
## 2021-11-04 NOTE — Consult Note (Signed)
   OB/GYN Telephone Consult  Janice Collins is a 41 y.o.  presenting with bleeding and cramping following MAB a few weeks ago.  I was called for a consult regarding the care of this patient by [facility].    The provider had a clinical question regarding management for RPOC.    The provider presented the following relevant clinical information and I performed a chart review on the patient and reviewed available documentation:  I reviewed her pelvic ultrasound images independently and my findings are: consistent with the radiologist findings. The lining is not fully tissue per their measurement but some fluid likely c/w bleeding.   I reviewed CareEverywhere: no chart present.   BP 108/72   Pulse (!) 57   Temp 99.4 F (37.4 C) (Oral)   Resp 20   Ht 5\' 5"  (1.651 m)   LMP  (LMP Unknown)   SpO2 100%   Exam- performed by consulting provider   Recommendations:  - Would do misoprostol vaginally 800 again as she has completed the hardest part of the process. If unsuccessful, then D&C.  - Message sent to office to get her in ASAP once back in the office.  - Can do zofran and ibuprofen to help with discomfort and  bleeding - I would also start her on Iron. Hgb 10.7.   -Recommended MD/APP provide the patient with a referral to the [facility] (any office) for follow up in   next week - message sent to the office.  .   Thank you for this consult and if additional recommendations are needed please call [phone] for the OB/GYN attending on service at [facility].   I spent approximately 5 minutes directly consulting with the provider and verbally discussing this case. Additionally 5 minutes minutes was spent performing chart review and documentation.   [facility], MD Attending Obstetrician & Gynecologist, [facility] for [facility], [facility]

## 2021-11-04 NOTE — ED Notes (Signed)
Pt ambulated to the restroom without assistance

## 2021-11-04 NOTE — Discharge Instructions (Addendum)
You need to follow up with the obgyn doctors.  If the medicine we are giving you does not work, you will need to have an operation.

## 2021-11-04 NOTE — ED Notes (Signed)
EDP at bedside with ultrasound 

## 2021-11-04 NOTE — Telephone Encounter (Signed)
I attempted to call patient with the help of Kinyarwanda interpreter Thayer Ohm ID [facility] to inform patient of results and follow up appointment for beta HCG. Patient did not answer. Left voicemail requesting patient call office back for results.  Alesia Richards, RN 11/04/21

## 2021-11-08 NOTE — Telephone Encounter (Signed)
Pt seen at ED on 11/04/21. [place] was suspicious for retained products of conception. Pt to be seen in our office 11/09/21 for follow up.

## 2021-11-09 ENCOUNTER — Ambulatory Visit (INDEPENDENT_AMBULATORY_CARE_PROVIDER_SITE_OTHER): Payer: Medicaid Other | Admitting: Obstetrics & Gynecology

## 2021-11-09 VITALS — BP 118/71 | HR 55 | Wt 154.6 lb

## 2021-11-09 DIAGNOSIS — O039 Complete or unspecified spontaneous abortion without complication: Secondary | ICD-10-CM

## 2021-11-09 NOTE — Progress Notes (Signed)
Patient ID: Janice Collins, female   DOB: [birth year], 41 y.o.   MRN: [id number]  Chief Complaint  Patient presents with   Follow-up    HPI Janice Collins is a 41 y.o. female.  [id number] No LMP recorded (lmp unknown). Patient had an incomplete miscarriage that failed to pass in April, with retained POC on [place] at ED visit 5/26. She received Cytotec for the second time and then passed heavy bleeding which is now minimal and occasional pain also improving HPI  Past Medical History:  Diagnosis Date   Medical history non-contributory     Past Surgical History:  Procedure Laterality Date   CESAREAN SECTION      Family History  Family history unknown: Yes    Social History Social History   Tobacco Use   Smoking status: Never   Smokeless tobacco: Never  Substance Use Topics   Alcohol use: Never   Drug use: Never    No Known Allergies  Current Outpatient Medications  Medication Sig Dispense Refill   ibuprofen (ADVIL) 600 MG tablet Take 1 tablet (600 mg total) by mouth every 6 (six) hours as needed. 30 tablet 0   ferrous sulfate 325 (65 FE) MG tablet Take 1 tablet (325 mg total) by mouth daily. (Patient not taking: Reported on 11/09/2021) 30 tablet 0   ibuprofen (ADVIL) 600 MG tablet Take 1 tablet (600 mg total) by mouth every 6 (six) hours as needed. (Patient not taking: Reported on 10/27/2021) 60 tablet 3   misoprostol (CYTOTEC) 200 MCG tablet Place four tablets in between your gums and cheeks (two tablets on each side) as instructed (Patient not taking: Reported on 11/09/2021) 4 tablet 1   ondansetron (ZOFRAN-ODT) 4 MG disintegrating tablet Take 1 tablet (4 mg total) by mouth every 8 (eight) hours as needed for nausea or vomiting. (Patient not taking: Reported on 11/09/2021) 20 tablet 0   promethazine (PHENERGAN) 25 MG tablet Take 1 tablet (25 mg total) by mouth every 6 (six) hours as needed for nausea or vomiting. (Patient not taking: Reported on 10/27/2021) 30 tablet 0   No  current facility-administered medications for this visit.    Review of Systems Review of Systems  Constitutional: Negative.   Respiratory: Negative.    Cardiovascular: Negative.   Genitourinary:  Positive for pelvic pain and vaginal bleeding.   Blood pressure 118/71, pulse (!) 55, weight 154 lb 9.6 oz (70.1 kg).  Physical Exam Physical Exam Vitals and nursing note reviewed.  Constitutional:      Appearance: Normal appearance.  Cardiovascular:     Rate and Rhythm: Normal rate.  Pulmonary:     Effort: Pulmonary effort is normal.  Neurological:     Mental Status: She is alert.  Psychiatric:        Mood and Affect: Mood normal.        Behavior: Behavior normal.    Data Reviewed  CLINICAL DATA:  Vaginal bleeding   EXAM: OBSTETRIC <14 WK ULTRASOUND   TECHNIQUE: Transabdominal ultrasound was performed for evaluation of the gestation as well as the maternal uterus and adnexal regions.   COMPARISON:  None Available.   FINDINGS: Intrauterine gestational sac: None   Yolk sac:  Not Visualized.   Embryo:  Not Visualized.   Cardiac Activity: Not Visualized.   Heart Rate:  bpm   MSD:    mm    w     d   CRL:     mm    w  d  [place] EDC:   Subchorionic hemorrhage:  None visualized.   Maternal uterus/adnexae: Thickened, complex appearance of the endometrium. Complex material including cystic areas within the endometrium measures 7.9 x 5.8 x 6.5 cm. Findings compatible with retained products of conception.   IMPRESSION: No intrauterine gestational sac. Complex soft tissue and fluid within the endometrial canal compatible with retained products of conception.     Electronically Signed   By: Charlett Nose M.D.   On: 11/04/2021 20:02 Assessment Symptoms of complete SAB after cytotec  Plan Report if her sx increase, o/w RTC in 2 weeks.  Kinyarwanda interpreter     Scheryl Darter 11/09/2021, 2:21 PM

## 2021-11-14 ENCOUNTER — Other Ambulatory Visit: Payer: Self-pay | Admitting: *Deleted

## 2021-11-14 DIAGNOSIS — O039 Complete or unspecified spontaneous abortion without complication: Secondary | ICD-10-CM

## 2021-11-18 ENCOUNTER — Other Ambulatory Visit: Payer: Medicaid Other

## 2021-11-18 ENCOUNTER — Other Ambulatory Visit: Payer: Self-pay | Admitting: *Deleted

## 2021-11-18 DIAGNOSIS — O039 Complete or unspecified spontaneous abortion without complication: Secondary | ICD-10-CM

## 2021-11-19 LAB — BETA HCG QUANT (REF LAB): hCG Quant: <1 m[IU]/mL

## 2021-12-01 ENCOUNTER — Ambulatory Visit (INDEPENDENT_AMBULATORY_CARE_PROVIDER_SITE_OTHER): Payer: Medicaid Other | Admitting: Obstetrics & Gynecology

## 2021-12-01 ENCOUNTER — Encounter: Payer: Self-pay | Admitting: Obstetrics & Gynecology

## 2021-12-01 VITALS — BP 119/77 | HR 71 | Wt 157.8 lb

## 2021-12-01 DIAGNOSIS — Z5189 Encounter for other specified aftercare: Secondary | ICD-10-CM | POA: Diagnosis not present

## 2021-12-01 DIAGNOSIS — O039 Complete or unspecified spontaneous abortion without complication: Secondary | ICD-10-CM | POA: Diagnosis not present

## 2022-01-03 NOTE — Telephone Encounter (Signed)
Error. Please disregard

## 2022-01-25 ENCOUNTER — Ambulatory Visit: Payer: Medicaid Other | Admitting: Certified Nurse Midwife

## 2022-02-02 ENCOUNTER — Other Ambulatory Visit: Payer: Self-pay

## 2022-02-02 ENCOUNTER — Other Ambulatory Visit (HOSPITAL_COMMUNITY)
Admission: RE | Admit: 2022-02-02 | Discharge: 2022-02-02 | Disposition: A | Discharge status: Home / Self Care | Payer: Medicaid Other | Source: Ambulatory Visit | Attending: Certified Nurse Midwife | Admitting: Certified Nurse Midwife

## 2022-02-02 ENCOUNTER — Encounter: Payer: Self-pay | Admitting: Family Medicine

## 2022-02-02 ENCOUNTER — Ambulatory Visit (INDEPENDENT_AMBULATORY_CARE_PROVIDER_SITE_OTHER): Payer: Medicaid Other | Admitting: Family Medicine

## 2022-02-02 VITALS — BP 124/77 | HR 63 | Ht 67.0 in | Wt 159.9 lb

## 2022-02-02 DIAGNOSIS — Z124 Encounter for screening for malignant neoplasm of cervix: Secondary | ICD-10-CM

## 2022-02-02 DIAGNOSIS — Z01419 Encounter for gynecological examination (general) (routine) without abnormal findings: Secondary | ICD-10-CM | POA: Diagnosis present

## 2022-02-02 DIAGNOSIS — Z113 Encounter for screening for infections with a predominantly sexual mode of transmission: Secondary | ICD-10-CM

## 2022-02-02 NOTE — Progress Notes (Signed)
Subjective:     Janice Collins is a 41 y.o. female and is here for a comprehensive physical exam. The patient reports no problems. S/p SAB earlier this year and took meds. Has had normal cycles since then. She would like to ensure there are no remaining issues. Desires STD screen.   The following portions of the patient's history were reviewed and updated as appropriate: allergies, current medications, past family history, past medical history, past social history, past surgical history, and problem list.  Review of Systems Pertinent items noted in HPI and remainder of comprehensive ROS otherwise negative.   Objective:    BP 124/77 (BP Location: Right Arm)   Pulse 63   Ht 5\' 7"  (1.702 m)   Wt 159 lb 14.4 oz (72.5 kg)   LMP 01/07/2022 (Exact Date) Comment: EDD: 01/24/2022  Breastfeeding No   BMI 25.04 kg/m  General appearance: alert, cooperative, and appears stated age Head: Normocephalic, without obvious abnormality, atraumatic Neck: no adenopathy, supple, symmetrical, trachea midline, and thyroid not enlarged, symmetric, no tenderness/mass/nodules Lungs: clear to auscultation bilaterally Breasts: normal appearance, no masses or tenderness Heart: regular rate and rhythm, S1, S2 normal, no murmur, click, rub or gallop Abdomen: soft, non-tender; bowel sounds normal; no masses,  no organomegaly Pelvic: cervix normal in appearance, external genitalia normal, no adnexal masses or tenderness, no cervical motion tenderness, uterus normal size, shape, and consistency, and vagina normal without discharge Extremities: extremities normal, atraumatic, no cyanosis or edema Pulses: 2+ and symmetric Skin: Skin color, texture, turgor normal. No rashes or lesions Lymph nodes: Cervical, supraclavicular, and axillary nodes normal. Neurologic: Grossly normal    Assessment:    Healthy female exam.      Plan:  Screening for malignant neoplasm of cervix - Plan: Cytology - PAP( [facility])  Encounter for gynecological examination without abnormal finding - should not be any further issues since SAB, no fever, no chills, normal cycles without any extra bleeding. - Plan: Cytology - PAP( No Name)  Screen for STD (sexually transmitted disease) - Plan: HIV antibody (with reflex), RPR, Hepatitis B Surface AntiGEN  Return in 1 year (on 02/03/2023).    See After Visit Summary for Counseling Recommendations

## 2022-02-02 NOTE — Patient Instructions (Signed)

## 2022-02-03 ENCOUNTER — Telehealth: Payer: Self-pay | Admitting: *Deleted

## 2022-02-03 LAB — RPR: RPR Ser Ql: NONREACTIVE

## 2022-02-03 LAB — HEPATITIS B SURFACE ANTIGEN: Hepatitis B Surface Ag: NEGATIVE

## 2022-02-03 LAB — HIV ANTIBODY (ROUTINE TESTING W REFLEX): HIV Screen 4th Generation wRfx: NONREACTIVE

## 2022-02-03 NOTE — Telephone Encounter (Addendum)
-----   Message from Reva Bores, MD sent at 02/03/2022  9:58 AM EDT ----- Her blood work is normal. Please inform pt.  8/25  1140  Called pt w/[employer] interpreter [phone] and she did not answer.  Message was left stating that we will call back next week to inform of test results - not urgent.    8/28  1205  Called pt w/[employer] interpreter [phone] and she did not answer. I left a message stating that her blood test results are normal.

## 2022-02-08 LAB — CYTOLOGY - PAP
Chlamydia: NEGATIVE
Comment: NEGATIVE
Comment: NEGATIVE
Comment: NEGATIVE
Comment: NORMAL
Diagnosis: NEGATIVE
High risk HPV: NEGATIVE
Neisseria Gonorrhea: NEGATIVE
Trichomonas: NEGATIVE

## 2022-03-29 ENCOUNTER — Encounter (HOSPITAL_COMMUNITY): Payer: Self-pay

## 2022-03-29 ENCOUNTER — Emergency Department (HOSPITAL_COMMUNITY)
Admission: EM | Admit: 2022-03-29 | Discharge: 2022-03-30 | Disposition: A | Discharge status: Home / Self Care | Payer: Medicaid Other | Attending: Emergency Medicine | Admitting: Emergency Medicine

## 2022-03-29 ENCOUNTER — Other Ambulatory Visit: Payer: Self-pay

## 2022-03-29 DIAGNOSIS — R0789 Other chest pain: Secondary | ICD-10-CM | POA: Diagnosis not present

## 2022-03-29 DIAGNOSIS — M25511 Pain in right shoulder: Secondary | ICD-10-CM | POA: Insufficient documentation

## 2022-03-29 DIAGNOSIS — R079 Chest pain, unspecified: Secondary | ICD-10-CM | POA: Diagnosis present

## 2022-03-29 NOTE — ED Triage Notes (Signed)
Eff[employer] interpreter used for triage.   Pt sts right upper chest pain/ shoulder and neck pain x 1 month. Denies injury.

## 2022-03-29 NOTE — ED Provider Triage Note (Signed)
Emergency Medicine Provider Triage Evaluation Note  Janice Collins , a 41 y.o. female  was evaluated in triage.  Pt complains of right neck pain and chest pain being on for about a month's time intermittent but now become constant chest pain is substernal does not radiate no shortness of breath no pleuritic chest pain no cardiac history no history PEs or DVTs denies any trauma to her shoulder..  Review of Systems  Positive: Chest pain, neck pain  Negative: SOB, leg swelling  Physical Exam  LMP  (LMP Unknown) Comment: EDD: 01/24/2022 Gen:   Awake, no distress   Resp:  Normal effort  MSK:   Moves extremities without difficulty  Other:    Medical Decision Making  Medically screening exam initiated at 11:53 PM.  Appropriate orders placed.  Janice Collins was informed that the remainder of the evaluation will be completed by another provider, this initial triage assessment does not replace that evaluation, and the importance of remaining in the ED until their evaluation is complete.  Lab work imaging ordered will need further work-up.   Marcello Fennel, PA-C 03/29/22 2354

## 2022-03-30 ENCOUNTER — Emergency Department (HOSPITAL_COMMUNITY): Payer: Medicaid Other

## 2022-03-30 LAB — CBC WITH DIFFERENTIAL/PLATELET
Abs Immature Granulocytes: 0 10*3/uL (ref 0.00–0.07)
Basophils Absolute: 0 10*3/uL (ref 0.0–0.1)
Basophils Relative: 1 %
Eosinophils Absolute: 0.1 10*3/uL (ref 0.0–0.5)
Eosinophils Relative: 4 %
HCT: 35 % — ABNORMAL LOW (ref 36.0–46.0)
Hemoglobin: 10.7 g/dL — ABNORMAL LOW (ref 12.0–15.0)
Immature Granulocytes: 0 %
Lymphocytes Relative: 45 %
Lymphs Abs: 1.7 10*3/uL (ref 0.7–4.0)
MCH: 26.1 pg (ref 26.0–34.0)
MCHC: 30.6 g/dL (ref 30.0–36.0)
MCV: 85.4 fL (ref 80.0–100.0)
Monocytes Absolute: 0.3 10*3/uL (ref 0.1–1.0)
Monocytes Relative: 8 %
Neutro Abs: 1.6 10*3/uL — ABNORMAL LOW (ref 1.7–7.7)
Neutrophils Relative %: 42 %
Platelets: 201 10*3/uL (ref 150–400)
RBC: 4.1 MIL/uL (ref 3.87–5.11)
RDW: 12.8 % (ref 11.5–15.5)
WBC: 3.8 10*3/uL — ABNORMAL LOW (ref 4.0–10.5)
nRBC: 0 % (ref 0.0–0.2)

## 2022-03-30 LAB — TROPONIN I (HIGH SENSITIVITY)
Troponin I (High Sensitivity): <2 ng/L (ref <18)
Troponin I (High Sensitivity): <2 ng/L (ref <18)

## 2022-03-30 LAB — BASIC METABOLIC PANEL
Anion gap: 9 (ref 5–15)
BUN: 10 mg/dL (ref 6–20)
CO2: 24 mmol/L (ref 22–32)
Calcium: 9.3 mg/dL (ref 8.9–10.3)
Chloride: 103 mmol/L (ref 98–111)
Creatinine, Ser: 0.54 mg/dL (ref 0.44–1.00)
GFR, Estimated: >60 mL/min (ref >60)
Glucose, Bld: 114 mg/dL — ABNORMAL HIGH (ref 70–99)
Potassium: 3.5 mmol/L (ref 3.5–5.1)
Sodium: 136 mmol/L (ref 135–145)

## 2022-03-30 LAB — I-STAT BETA HCG BLOOD, ED (MC, WL, AP ONLY): I-stat hCG, quantitative: <5 m[IU]/mL (ref <5)

## 2022-03-30 MED ORDER — IBUPROFEN 600 MG PO TABS: 600.0000 mg | ORAL_TABLET | Freq: Four times a day (QID) | ORAL | 0 refills | Status: AC | PRN

## 2022-03-30 MED ORDER — DICLOFENAC SODIUM 1 % EX GEL: 2.0000 g | Freq: Four times a day (QID) | CUTANEOUS | 0 refills | Status: AC

## 2022-03-30 NOTE — ED Provider Notes (Signed)
Emergency Department Provider Note   I have reviewed the triage vital signs and the nursing notes.   HISTORY  Chief Complaint Chest Pain  Kinyarwanda video interpreter used for this encounter.   HPI Janice Collins is a 41 y.o. female with no know PMH presents to the ED with right shoulder and chest pain. Symptoms are worse with lifting. No exertional CP. No SOB or diaphoresis.  Symptoms have been ongoing for the past month.  No prior history of heart issues.   Past Medical History:  Diagnosis Date   Medical history non-contributory     Review of Systems  Constitutional: No fever/chills Cardiovascular: Denies chest pain. Respiratory: Denies shortness of breath. Gastrointestinal: No abdominal pain. Musculoskeletal: Negative for back pain. Skin: Negative for rash. Neurological: Negative for headaches, focal weakness or numbness.  ____________________________________________   PHYSICAL EXAM:  VITAL SIGNS: ED Triage Vitals  Enc Vitals Group     BP 03/29/22 2355 118/76     Pulse Rate 03/29/22 2355 67     Resp 03/29/22 2355 18     Temp 03/29/22 2355 98.1 F (36.7 C)     Temp src --      SpO2 03/29/22 2355 99 %     Weight 03/29/22 2357 160 lb (72.6 kg)     Height 03/29/22 2357 5\' 7"  (1.702 m)   Constitutional: Alert and oriented. Well appearing and in no acute distress. Eyes: Conjunctivae are normal.  Head: Atraumatic. Nose: No congestion/rhinnorhea. Mouth/Throat: Mucous membranes are moist. Cardiovascular: Normal rate, regular rhythm. Good peripheral circulation. Grossly normal heart sounds.   Respiratory: Normal respiratory effort.  No retractions. Lungs CTAB. Gastrointestinal: Soft and nontender. No distention.  Musculoskeletal: No lower extremity tenderness nor edema. No gross deformities of extremities. Normal ROM of the right shoulder, elbow, and wrist.  Neurologic:  Normal speech and language. Normal strength and sensation in the bilateral upper  extremities.   ____________________________________________   LABS (all labs ordered are listed, but only abnormal results are displayed)  Labs Reviewed  BASIC METABOLIC PANEL - Abnormal; Notable for the following components:      Result Value   Glucose, Bld 114 (*)    All other components within normal limits  CBC WITH DIFFERENTIAL/PLATELET - Abnormal; Notable for the following components:   WBC 3.8 (*)    Hemoglobin 10.7 (*)    HCT 35.0 (*)    Neutro Abs 1.6 (*)    All other components within normal limits  I-STAT BETA HCG BLOOD, ED ([facility], [facility], [facility] ONLY)  TROPONIN I (HIGH SENSITIVITY)  TROPONIN I (HIGH SENSITIVITY)   ____________________________________________  EKG   EKG Interpretation  Date/Time:  Thursday March 30 2022 00:06:35 EDT Ventricular Rate:  64 PR Interval:  198 QRS Duration: 76 QT Interval:  438 QTC Calculation: 451 R Axis:   4 Text Interpretation: Normal sinus rhythm Normal ECG No previous ECGs available Confirmed by 02[birth year]17[birth year]1993 [phone]) on 03/30/2022 1:13:34 PM        ____________________________________________  RADIOLOGY  DG Chest 2 View  Result Date: 03/30/2022 CLINICAL DATA:  Chest pain EXAM: CHEST - 2 VIEW COMPARISON:  None Available. FINDINGS: Borderline cardiomegaly. Both lungs are clear. The visualized skeletal structures are unremarkable. IMPRESSION: No active cardiopulmonary disease.  Borderline cardiomegaly Electronically Signed   By: 04/01/2022 M.D.   On: 03/30/2022 00:14    ____________________________________________   PROCEDURES  Procedure(s) performed:   Procedures  None  ____________________________________________   INITIAL IMPRESSION / ASSESSMENT AND PLAN / ED  COURSE  Pertinent labs & imaging results that were available during my care of the patient were reviewed by me and considered in my medical decision making (see chart for details).   This patient is Presenting for Evaluation of CP, which does require a range  of treatment options, and is a complaint that involves a high risk of morbidity and mortality.  The Differential Diagnoses includes but is not exclusive to acute coronary syndrome, aortic dissection, pulmonary embolism, cardiac tamponade, community-acquired pneumonia, pericarditis, musculoskeletal chest wall pain, etc.    Clinical Laboratory Tests Ordered, included troponin x 2. No AKI. No anemia.   Radiologic Tests Ordered, included CXR. I independently interpreted the images and agree with radiology interpretation.   Social Determinants of Health Risk no smoking history.   Consult complete with  Medical Decision Making: Summary:  Patient presents to the emergency department for evaluation of right upper chest and shoulder discomfort for the past month.  Seems very musculoskeletal.  EKG reassuring as above.  Troponins negative x2.  Plan for MSK medications for management at home with contact information for PCP to establish care.  Reviewed impression and plan with patient using the interpreter.   Disposition: discharge  ____________________________________________  FINAL CLINICAL IMPRESSION(S) / ED DIAGNOSES  Final diagnoses:  Atypical chest pain  Acute pain of right shoulder     NEW OUTPATIENT MEDICATIONS STARTED DURING THIS VISIT:  Discharge Medication List as of 03/30/2022  2:02 PM     START taking these medications   Details  diclofenac Sodium (VOLTAREN) 1 % GEL Apply 2 g topically 4 (four) times daily for 13 days., Starting Thu 03/30/2022, Until Wed 04/12/2022, Normal    ibuprofen (ADVIL) 600 MG tablet Take 1 tablet (600 mg total) by mouth every 6 (six) hours as needed., Starting Thu 03/30/2022, Normal        Note:  This document was prepared using Dragon voice recognition software and may include unintentional dictation errors.  Nanda Quinton, MD, [facility] Emergency Medicine    Inella Kuwahara, [facility], MD 03/30/22 [phone]

## 2023-01-13 ENCOUNTER — Encounter: Admit: 2023-01-13 | Discharge: 2023-01-13 | Payer: MEDICAID

## 2023-01-13 ENCOUNTER — Emergency Department: Admit: 2023-01-13 | Discharge: 2023-01-13 | Payer: MEDICAID

## 2023-01-13 DIAGNOSIS — H9201 Otalgia, right ear: Secondary | ICD-10-CM

## 2023-01-13 DIAGNOSIS — M62830 Muscle spasm of back: Secondary | ICD-10-CM

## 2023-01-13 DIAGNOSIS — I517 Cardiomegaly: Secondary | ICD-10-CM

## 2023-01-13 DIAGNOSIS — T148XXA Other injury of unspecified body region, initial encounter: Secondary | ICD-10-CM

## 2023-01-13 DIAGNOSIS — R0789 Other chest pain: Secondary | ICD-10-CM

## 2023-01-13 LAB — CBC AND DIFF
ABSOLUTE BASO COUNT: 0 10*3/uL (ref 0–0.20)
ABSOLUTE EOS COUNT: 0.1 10*3/uL (ref 0–0.45)
ABSOLUTE MONO COUNT: 0.3 10*3/uL (ref 0–0.80)
MDW (MONOCYTE DISTRIBUTION WIDTH): 18 (ref <20.7)
WBC COUNT: 3.6 10*3/uL — ABNORMAL LOW (ref 4.5–11.0)

## 2023-01-13 LAB — COMPREHENSIVE METABOLIC PANEL
ALBUMIN: 4 g/dL — ABNORMAL LOW (ref 3.5–5.0)
ALK PHOSPHATASE: 55 U/L — ABNORMAL HIGH (ref 25–110)
ALT: 7 U/L (ref 7–56)
ANION GAP: 6 10*3/uL — ABNORMAL LOW (ref 3–12)
AST: 18 U/L (ref 7–40)
BLD UREA NITROGEN: 9 mg/dL — ABNORMAL LOW (ref 7–25)
CALCIUM: 8.4 mg/dL — ABNORMAL LOW (ref 8.5–10.6)
CO2: 27 MMOL/L (ref 21–30)
CREATININE: 0.6 mg/dL (ref 0.4–1.00)
EGFR: >60 mL/min (ref >60)
SODIUM: 138 MMOL/L (ref 137–147)
TOTAL BILIRUBIN: 0.8 mg/dL (ref 0.2–1.3)
TOTAL PROTEIN: 7.5 g/dL (ref 6.0–8.0)

## 2023-01-13 LAB — PTT (APTT): PTT: 32 s — ABNORMAL LOW (ref 24.0–36.5)

## 2023-01-13 LAB — HIGH SENSITIVITY TROPONIN I 0 HOUR: HIGH SENSITIVITY TROPONIN I 0 HOUR: 0 ng/L — ABNORMAL LOW (ref <12)

## 2023-01-13 LAB — PROTIME INR (PT): PROTIME: 12 s — ABNORMAL LOW (ref 10.2–12.9)

## 2023-01-13 MED ORDER — METHOCARBAMOL 750 MG PO TAB
750 mg | Freq: Once | ORAL | 0 refills | Status: CP
Start: 2023-01-13 — End: ?
  Administered 2023-01-13: 20:00:00 750 mg via ORAL

## 2023-01-13 MED ORDER — METHOCARBAMOL 750 MG PO TAB
750 mg | ORAL_TABLET | Freq: Four times a day (QID) | ORAL | 0 refills | 10.00000 days | Status: AC
Start: 2023-01-13 — End: ?

## 2023-01-13 MED ORDER — ACETAMINOPHEN 500 MG PO TAB
1000 mg | Freq: Once | ORAL | 0 refills | Status: CP
Start: 2023-01-13 — End: ?
  Administered 2023-01-13: 20:00:00 1000 mg via ORAL

## 2023-01-13 NOTE — Telephone Encounter
Pt's prescription was left in the ER. Called patient with interpreter [phone] [facility]. Left a message stating the prescription was at the triage desk, and the patient was welcome to come back to get it.

## 2023-01-13 NOTE — ED Notes
42 y.o. female to ED42 with c/o chest pain and headache. Pt states the chest pain is located on her left side under her breast. Pt states the pain is 7/10 when she up lifting stuff, but doesn't hurt when she is sitting. Pt also endorses some SOA when lifting as well. Pt states she has also had a HA for about a month now as well. Pt also c/o right-sided ear pain. Pt is A&Ox4, VSS, NAD. Pt is sitting on cart, call light within reach. Pt denies any further questions or concerns at this time.     No past medical history on file.

## 2023-01-13 NOTE — ED Notes
Pt A&Ox4, VSS, with no complaints. Pt given discharge instructions. Pt verbalizes understanding of discharge instructions. Pt states no further questions or comments at this point in time.  Pt ambulated out of ER with a steady gait.

## 2023-02-12 IMAGING — US US OB TRANSVAGINAL
1 series · 15 of 28 positions shown · non-contrast
Comparison: August 09, 2021

CLINICAL DATA: Abdominal pain x4 days.

EXAM:
TRANSVAGINAL OB ULTRASOUND
TECHNIQUE: Transvaginal ultrasound was performed for complete evaluation of the
gestation as well as the maternal uterus, adnexal regions, and
pelvic cul-de-sac.

[Series 1: us ob transvaginal · 35 acquisitions, 15 frames shown]
[im 1/35]
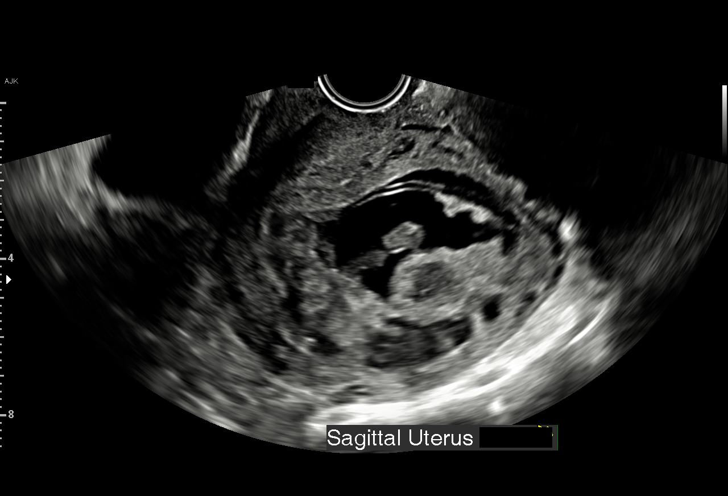
[im 3/35]
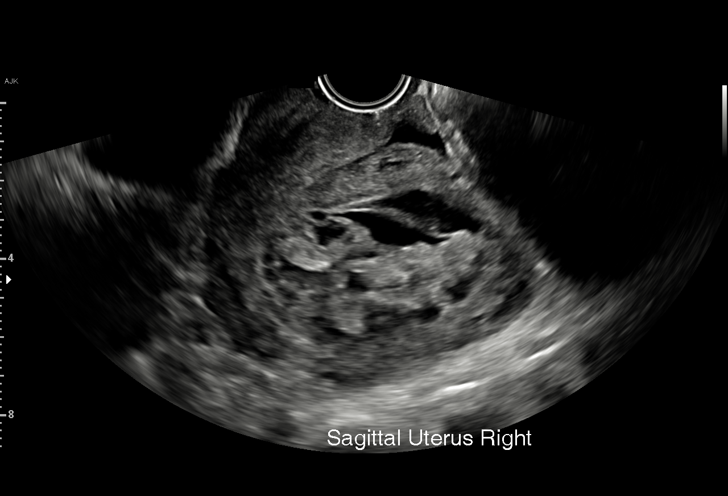
[im 6/35]
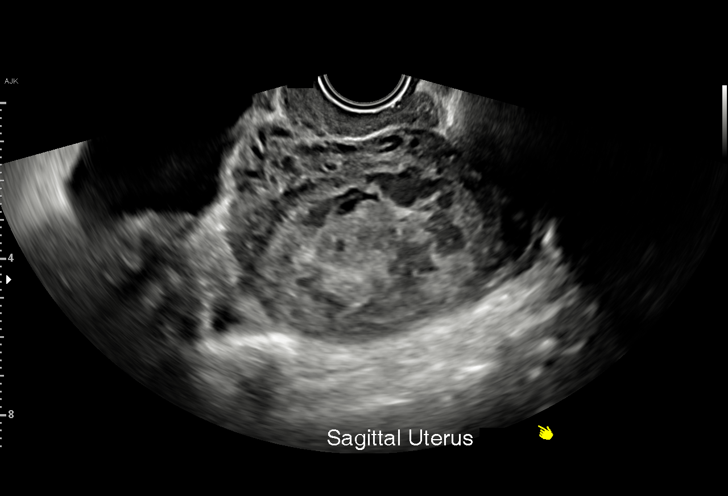
[im 8/35]
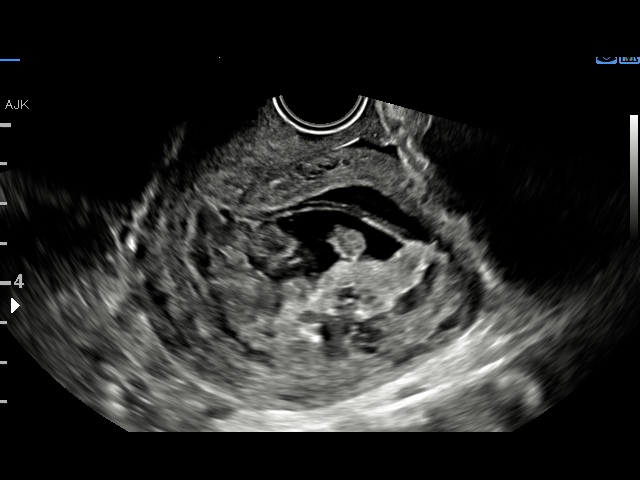
[im 11/35]
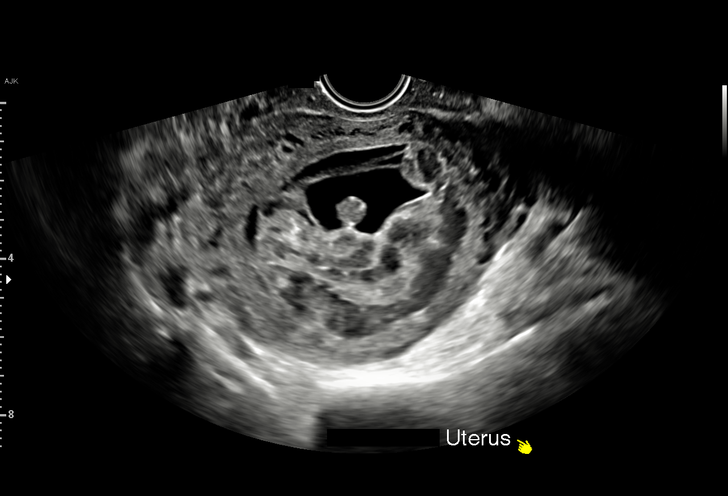
[im 13/35]
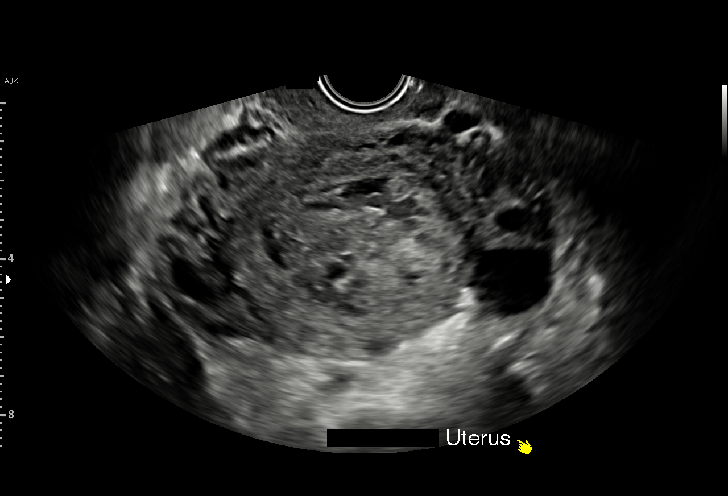
[im 16/35]
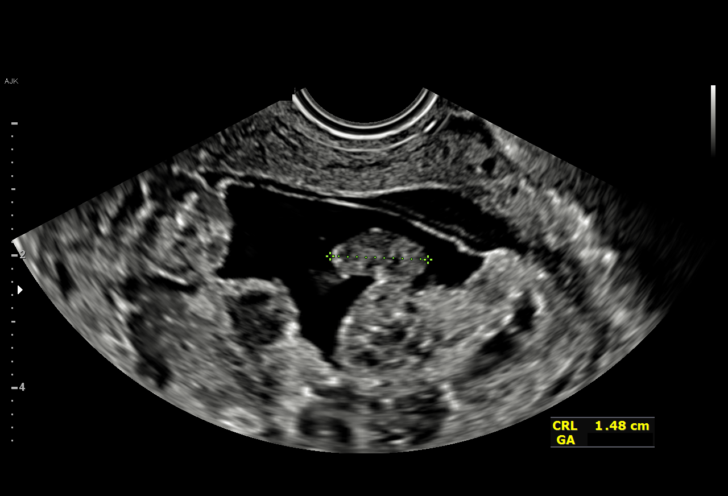
[im 18/35]
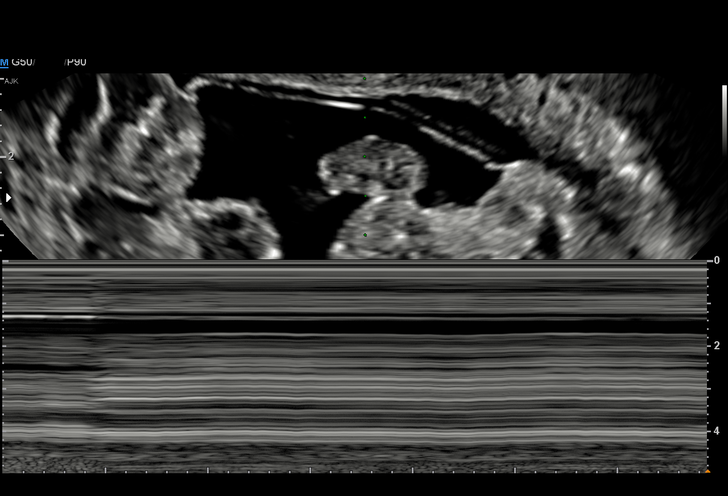
[im 19/35]
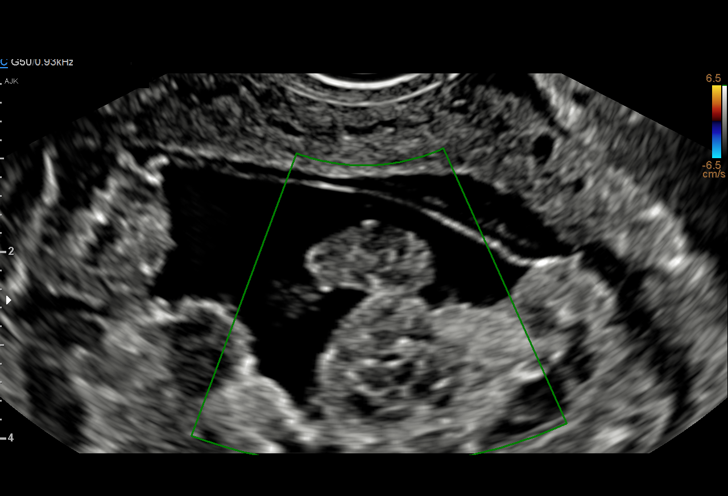
[im 22/35]
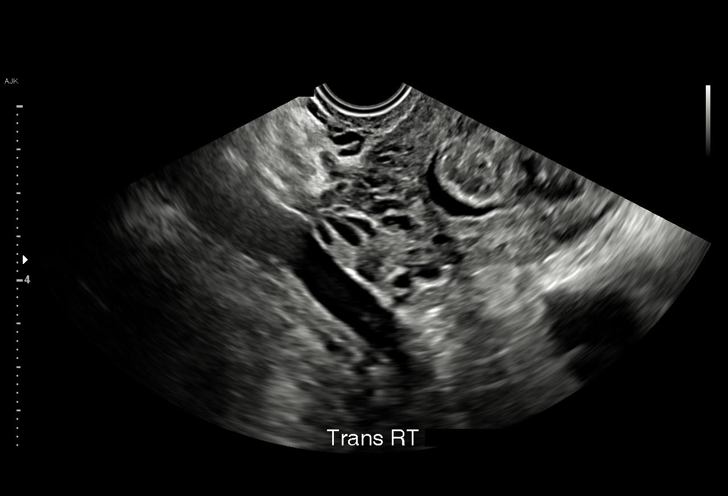
[im 24/35]
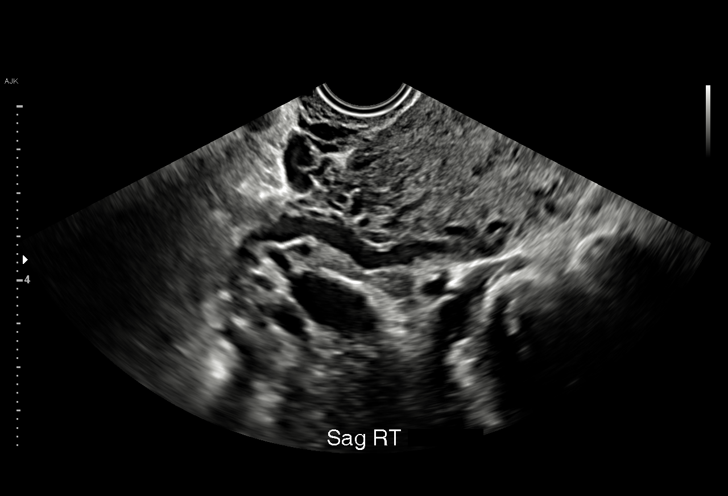
[im 27/35]
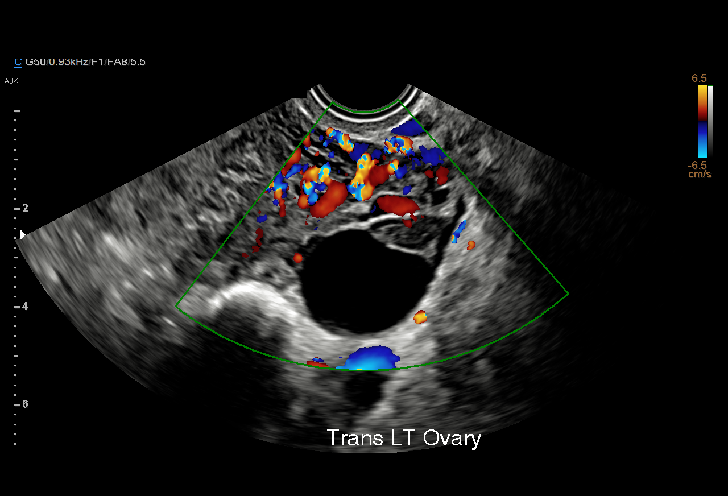
[im 29/35]
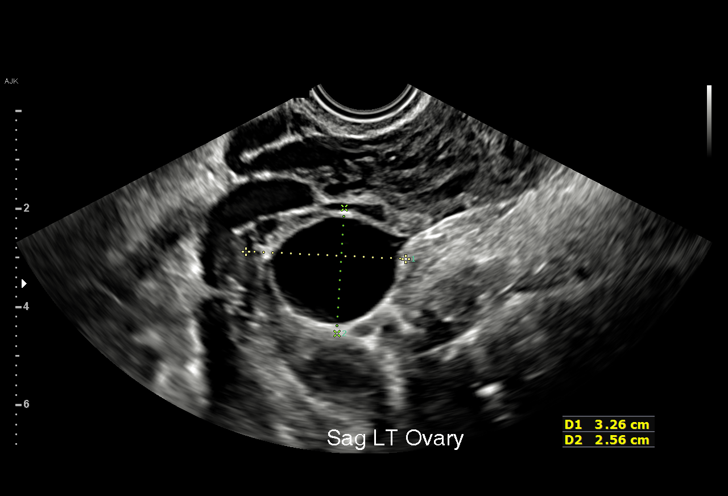
[im 32/35]
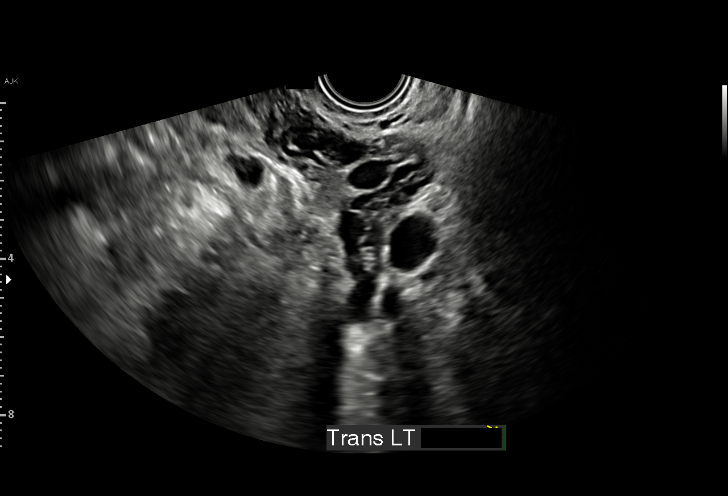
[im 35/35]
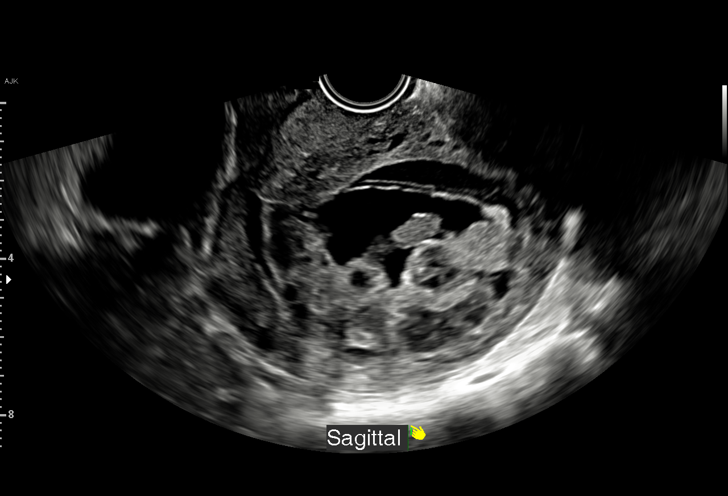

[15 of 28 positions shown; findings below may reference images not displayed]

FINDINGS: Intrauterine gestational sac: Single

Yolk sac:  Not Visualized.

Embryo:  Visualized.

Cardiac Activity: Not Visualized.

Heart Rate: N/A bpm

CRL:   14.8 mm   7 w 5 d                  US EDC: May 21, 2022

Subchorionic hemorrhage:  Moderate sized.

Maternal uterus/adnexae: The right ovary is poorly visualized.

The left ovary is visualized and measures 3.3 cm x 2.6 cm x 2.8 cm.

A trace amount of pelvic free fluid is noted.
IMPRESSION: Single intrauterine pregnancy (approximately 7 weeks and 5 days
gestation by ultrasound evaluation), without cardiac activity.
Findings meet definitive criteria for failed pregnancy (crown-rump
length greater than or equal to 7 mm and no heartbeat). This follows
SRU consensus guidelines: Diagnostic Criteria for Nonviable
Pregnancy Early in the First Trimester. N Engl J Med
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
## 2023-03-12 IMAGING — US US OB COMP LESS 14 WK
2 series · 14 of 28 positions shown · non-contrast
Comparison: None Available.

CLINICAL DATA: Vaginal bleeding

EXAM:
OBSTETRIC <14 WK ULTRASOUND
TECHNIQUE: Transabdominal ultrasound was performed for evaluation of the
gestation as well as the maternal uterus and adnexal regions.

[Series 1: us ob less than 14 weeks with ob transvaginal · 61 acquisitions, 13 frames shown (1 of 2)]
[im 3/61]
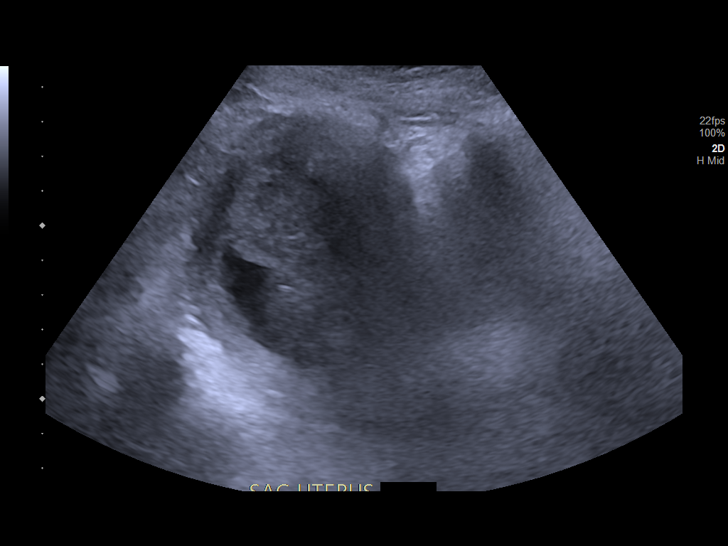
[im 7/61]
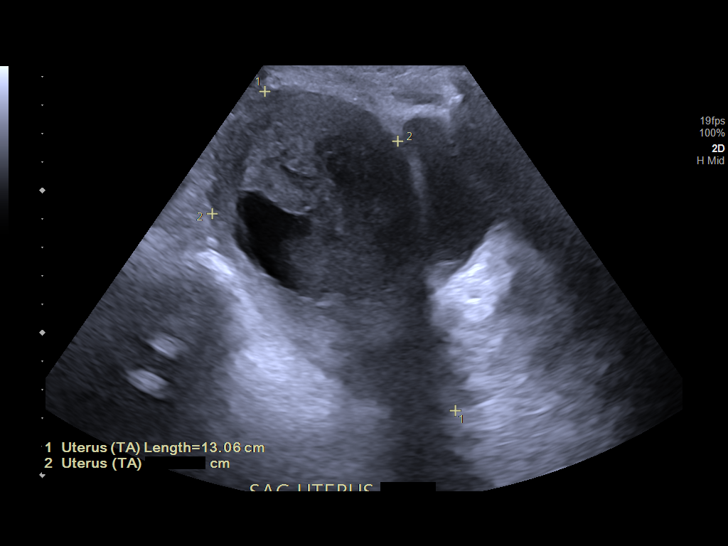
[im 12/61]
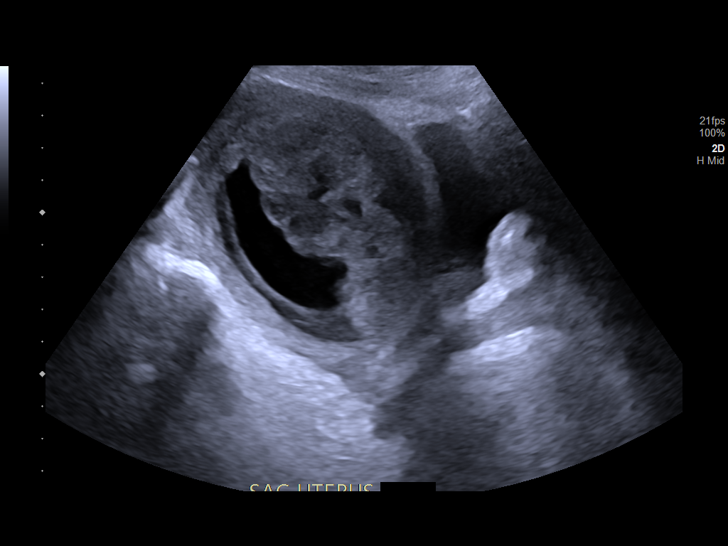
[im 17/61]
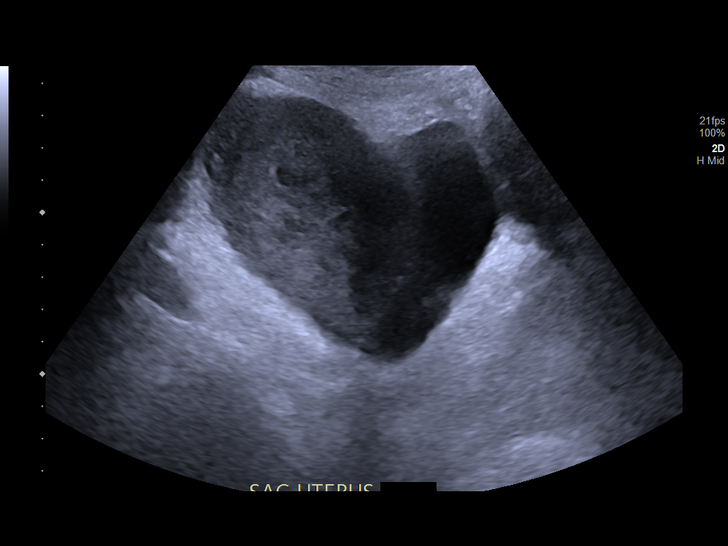
[im 21/61]
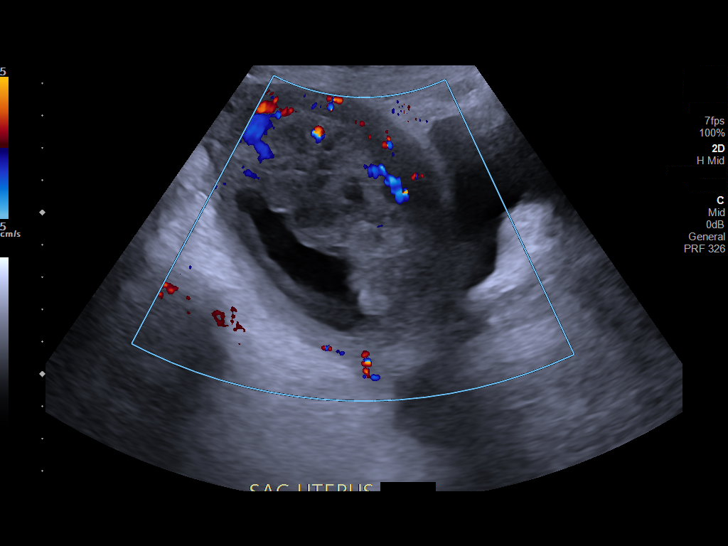
[im 26/61]
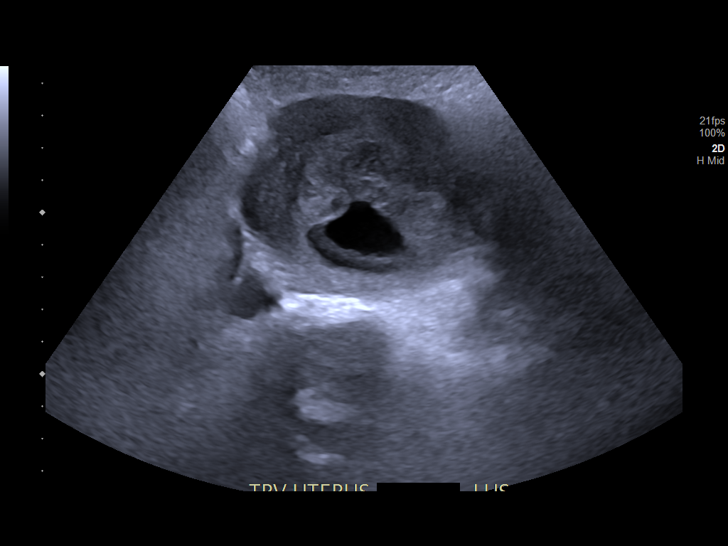
[im 30/61]
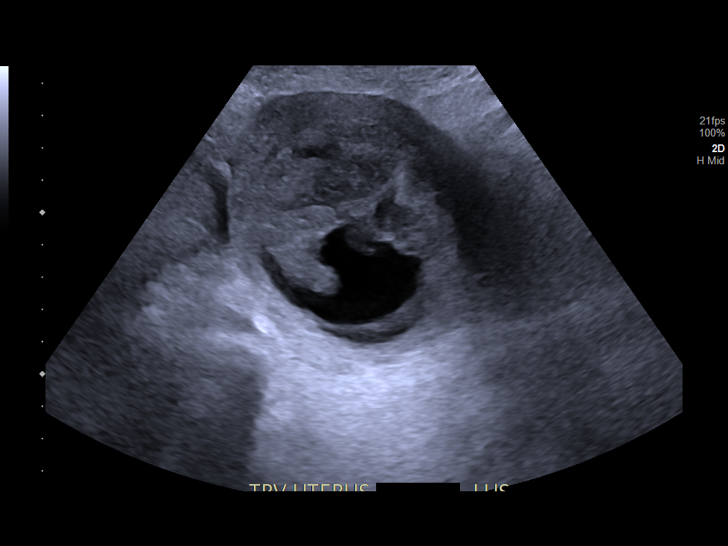
[im 35/61]
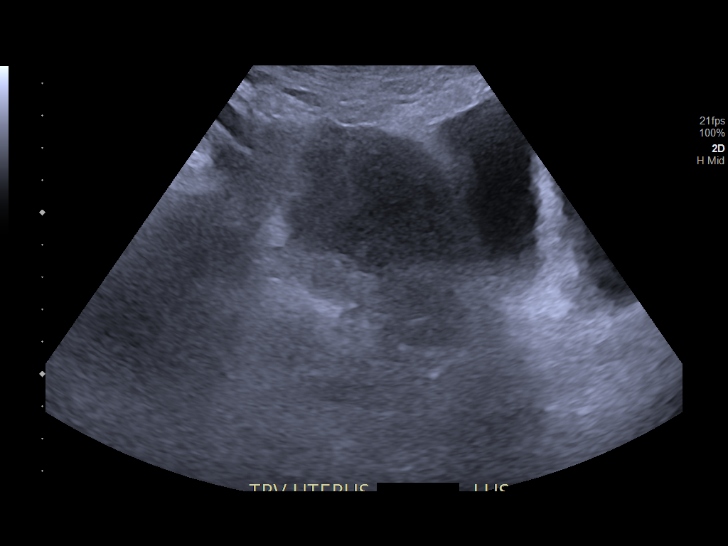
[im 40/61]
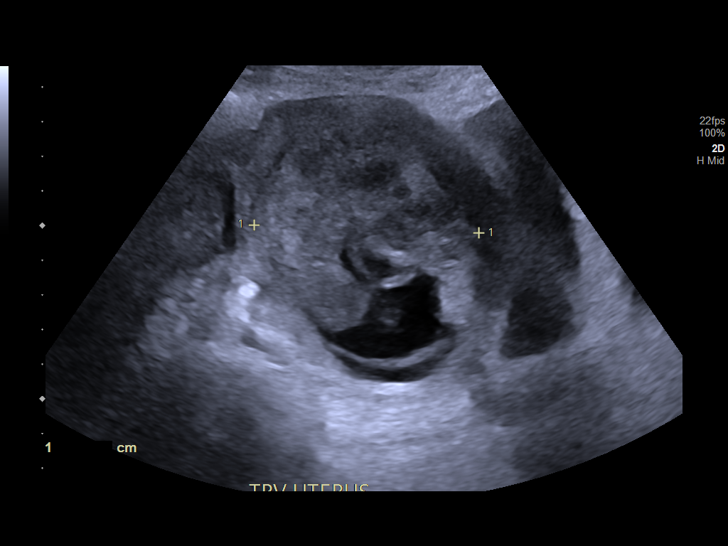
[im 44/61]
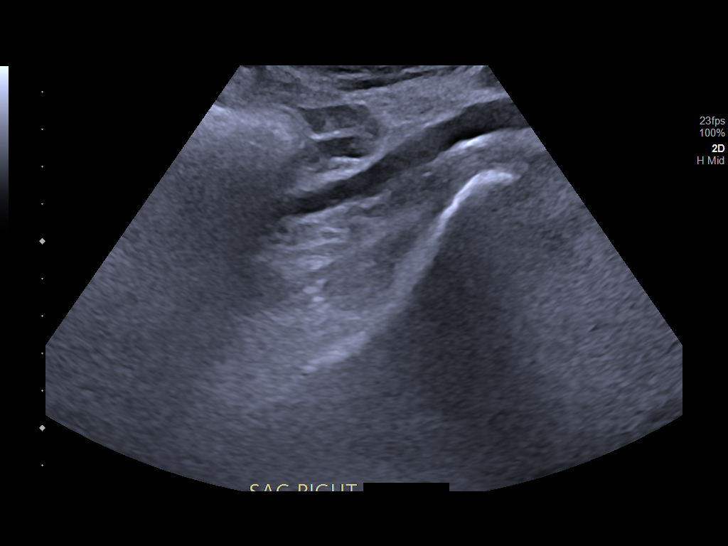
[im 49/61]
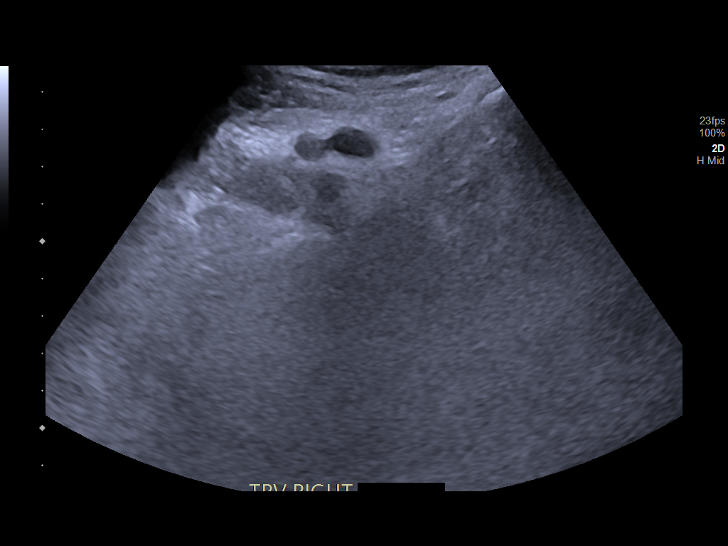
[im 54/61]
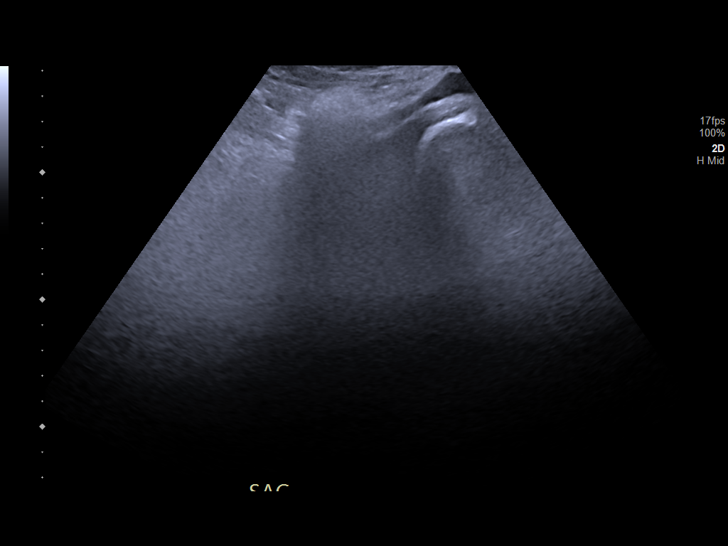
[im 58/61]
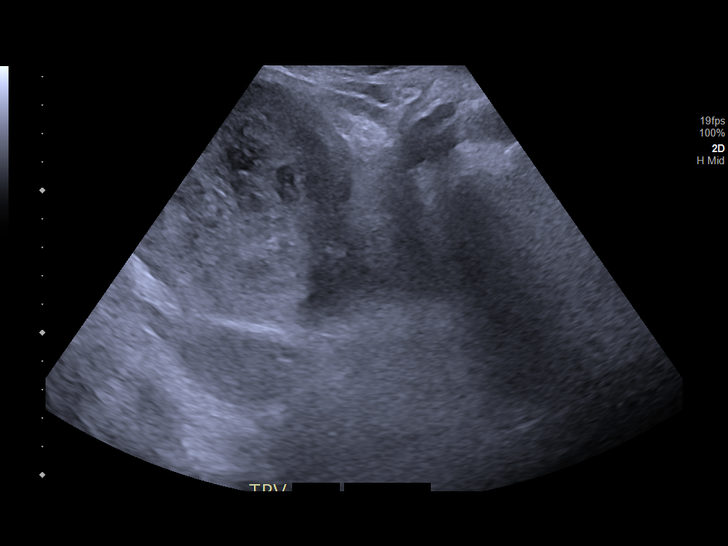

[Series 1001: us ob less than 14 weeks with ob transvaginal · 1 of 2 slices shown (2 of 2)]
[im 1/2]
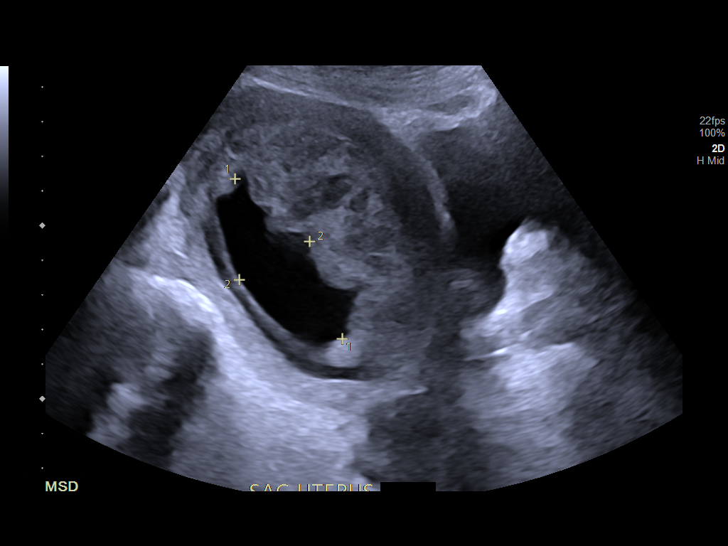

[14 of 28 positions shown; findings below may reference images not displayed]

FINDINGS: Intrauterine gestational sac: None

Yolk sac:  Not Visualized.

Embryo:  Not Visualized.

Cardiac Activity: Not Visualized.

Heart Rate:  bpm

MSD:    mm    w     d

CRL:     mm    w  d                  US EDC:

Subchorionic hemorrhage:  None visualized.

Maternal uterus/adnexae: Thickened, complex appearance of the
endometrium. Complex material including cystic areas within the
endometrium measures 7.9 x 5.8 x 6.5 cm. Findings compatible with
retained products of conception.
IMPRESSION: No intrauterine gestational sac. Complex soft tissue and fluid
within the endometrial canal compatible with retained products of
conception.

## 2023-05-17 ENCOUNTER — Emergency Department: Admit: 2023-05-17 | Discharge: 2023-05-17 | Payer: MEDICAID

## 2023-05-17 ENCOUNTER — Encounter: Admit: 2023-05-17 | Discharge: 2023-05-17 | Payer: MEDICAID

## 2023-05-17 ENCOUNTER — Emergency Department: Admit: 2023-05-17 | Discharge: 2023-05-17 | Disposition: A | Discharge status: Home / Self Care | Payer: MEDICAID

## 2023-11-01 ENCOUNTER — Encounter: Admit: 2023-11-01 | Discharge: 2023-11-01 | Payer: MEDICAID

## 2023-11-01 ENCOUNTER — Observation Stay: Admit: 2023-11-01 | Discharge: 2023-11-02 | Payer: MEDICAID

## 2023-11-01 ENCOUNTER — Emergency Department: Admit: 2023-11-01 | Discharge: 2023-11-01 | Payer: MEDICAID

## 2023-11-02 ENCOUNTER — Encounter: Admit: 2023-11-02 | Discharge: 2023-11-02 | Payer: MEDICAID

## 2023-11-02 MED FILL — LIDOCAINE 5 % TP PTMD: 5 % | TOPICAL | 30 days supply | Qty: 30 | Fill #1 | Status: CP

## 2023-11-02 NOTE — Progress Notes
 Patient presented to ER on 11/01/2023 with concerns of back pain. Patient is not established with provider at [facility]    Ultrasound:  D-OB Transabdominal Clinical           Indication(s) for Exam:            Other:  positive pregnancy test, back pain         Views:            Transabdominal Uterus Sagittal:  Adequate           Transabdominal Uterus Transverse:  Adequate           Cul-de-sac:  Adequate           Left Adnexa:  Adequate           Right Adnexa:  Limited           Left ovary:  Limited           Right ovary:  Not obtained         Findings:            Intrauterine Pregnancy:  Present           Cul-de-sac Fluid:  Small           Other:  Yolk Sac, Fetal Pole           Fetal Heart Activity:  Present           Fetal Heart Rate (bpm)::  110           Crown Rump Length (mm):  7.3           Gestational Age::  [redacted]w[redacted]d           Other:  Subchorionic hematoma.         Interpretation:            Intrauterine Pregnancy:  Live Intrauterine Pregnancy           Ectopic pregnancy:  No identifiable ectopic pregnancy           Free Pelvic Fluid:  Small           Free Intraperitoneal Fluid (RUQ):  None           Other::  Subchorionic hematoma.       EPC plan of care:      Viable IUP. Patient may call to schedule if desiring care with [facility].

## 2023-11-08 ENCOUNTER — Encounter: Admit: 2023-11-08 | Discharge: 2023-11-08 | Payer: MEDICAID

## 2023-11-08 NOTE — Telephone Encounter
 Called pt with Kinyarwandan interpreter [phone] for OB intake. 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 R/s pt for intake next day.

## 2023-11-09 ENCOUNTER — Ambulatory Visit: Admit: 2023-11-09 | Discharge: 2023-11-10 | Payer: MEDICAID

## 2023-11-09 ENCOUNTER — Encounter: Admit: 2023-11-09 | Discharge: 2023-11-09 | Payer: MEDICAID

## 2023-11-09 DIAGNOSIS — Z3490 Encounter for supervision of normal pregnancy, unspecified, unspecified trimester: Secondary | ICD-10-CM

## 2023-11-09 NOTE — Progress Notes
 Patient is 43 y.o. [id number] [redacted]w[redacted]d by Patient's last menstrual period was 09/16/2023 (approximate).  Pregnancy confirmed by urine pregnancy test and u/s.    IPV date: 12/18/23  GA at IPV: [redacted]w[redacted]d  Request for earlier appointment: No, patient is scheduled as first available and requested a later appt d/t work schedule.    43 y.o. [id number] at [redacted]w[redacted]d gestational age. Currently taking a prenatal vitamin. Review medical history and current medications below. Patient had prenatal labs drawn last week. Ordered u/s and provided the phone number for the [facility] schedulers. Encouraged to communicate with nurse team via MyChart and provided the phone number for the team. Informed that RN will send educational resources, safe medication list, and DHA survey information via MyChart if it is active. Encouraged to complete DHA survey and advised that DHA is a nutrient found in fish, eggs, and many prenatal vitamins. Informed that consuming enough DHA from food and prenatal supplements can reduce the risk of preterm birth.     Provided intake education. Reviewed food safety and advised to cook all meat, eggs, and seafood thoroughly. Advised to avoid unpasteurized milk and foods made with unpasteurized milk, including soft cheeses.   No cats at home. Instructed to avoid changing cat litter and reviewed precautions regarding gardening to avoid toxoplasmosis. Reviewed schedule of prenatal visits.   Advised that [facility] is an academic medical center and teaching facility, therefore we have residents and medical students that may be involved in patient care. Patient is aware that we have a diverse group of providers on labor and delivery and therefore cannot guarantee an all female call team for delivery. All questions answered. Patient verbalized understanding. In case of an emergency, this patient will accept blood or blood products. Routed to Dr. Celina Colla and Dr. Brynda Cara for review. Interpreter [phone] used for this call.      OB History   Gravida Para Term Preterm AB Living   10 8 8  1 8    SAB IAB Ectopic Multiple Live Births   1    8      # Outcome Date GA Lbr Len/2nd Weight Sex Type Anes PTL Lv   10 Current            9 SAB 2022           8 Term 2021     CS-LTranv         Birth Comments: C-section for arrest of descent   7 Term      Vag-Spont      6 Term      Vag-Spont      5 Term      Vag-Spont      4 Term      Vag-Spont      3 Term      Vag-Spont      2 Term      Vag-Spont      1 Term      Vag-Spont        Past Medical History:    Back pain     Surgical History:   Procedure Laterality Date    CESAREAN SECTION  2021     No family history on file.  Social History     Socioeconomic History    Marital status: Married    Number of children: 8   Tobacco Use    Smoking status: Never    Smokeless tobacco: Never   Vaping Use    Vaping status: Never Used  Substance and Sexual Activity    Alcohol use: Not Currently    Drug use: Never    Sexual activity: Yes     Partners: Male     Vaping/E-liquid Use    Vaping Use Never User                   Social History     Tobacco Use   Smoking Status Never   Smokeless Tobacco Never     Social History     Substance and Sexual Activity   Alcohol Use Not Currently     Social History     Substance and Sexual Activity   Drug Use Never         Current Outpatient Medications:     benzonatate (TESSALON PERLES) 100 mg capsule, Take one capsule by mouth every 8 hours. (Patient not taking: Reported on 11/09/2023), Disp: 20 capsule, Rfl: 0    lidocaine (LIDODERM) 5 % topical patch, Apply one patch topically to affected area daily. Apply patch for 12 hours, then remove for 12 hours before repeating.  Indications: neuropathic pain, Disp: 30 patch, Rfl: 3    prenatal vit-iron fum-folic ac 28 mg iron- 800 mcg tablet, Take one tablet by mouth daily., Disp: 90 tablet, Rfl: 3    Initial prenatal labs ordered No  NT ordered Yes  AFP No  Quad Screening No  Requested outside or operative records No  DHA Survery Sent via MyChart: No    Appointment scheduled with Dr. Celina Colla on 12/18/23.  Future Appointments   Date Time Provider Department Center   12/11/2023  2:00 PM Shirlee Dotter, MD [facility]   12/12/2023  3:00 PM OBGYN SONO RM 5 [facility]   12/12/2023  3:30 PM OBGYN GENETIC COUNSELING [facility]   12/18/2023  2:15 PM Lonna Roam, DO [facility]

## 2023-11-13 ENCOUNTER — Encounter: Admit: 2023-11-13 | Discharge: 2023-11-13 | Payer: MEDICAID

## 2023-11-13 DIAGNOSIS — M5416 Radiculopathy, lumbar region: Secondary | ICD-10-CM

## 2023-11-22 NOTE — Patient Instructions
 It was a pleasure seeing you in clinic today.  Please don't hesitate to call or send a MyChart message if you have any questions.     DO NOT call or send a MyChart message asking for any test or imaging (MRI, CT, etc.) results.  All results will be discussed with you at your follow-up appointment.     Laverle Patter RN, BSN  Clinical Nurse Coordinator  Dr. Greer Pickerel  Alexsis Johnson APRN-NP   Coralie Common PA  Vaiden Darland PA  Sue Lush PA  [facility]  Vernia Buff A. [facility]  [address]  [place], [place] [address]  [email or web address]  Phone: [phone]  Fax:  [phone]  Scheduling [phone]

## 2023-11-29 ENCOUNTER — Encounter: Admit: 2023-11-29 | Discharge: 2023-11-29 | Payer: PRIVATE HEALTH INSURANCE

## 2023-11-29 ENCOUNTER — Emergency Department
Admit: 2023-11-29 | Discharge: 2023-11-30 | Disposition: A | Discharge status: Home / Self Care | Payer: PRIVATE HEALTH INSURANCE | Attending: Family

## 2023-11-29 ENCOUNTER — Emergency Department: Admit: 2023-11-29 | Discharge: 2023-11-29 | Payer: PRIVATE HEALTH INSURANCE | Attending: Family

## 2023-12-03 ENCOUNTER — Encounter: Admit: 2023-12-03 | Discharge: 2023-12-03 | Payer: MEDICAID

## 2023-12-11 ENCOUNTER — Encounter: Admit: 2023-12-11 | Discharge: 2023-12-11 | Payer: MEDICAID

## 2023-12-11 ENCOUNTER — Ambulatory Visit: Admit: 2023-12-11 | Discharge: 2023-12-11 | Payer: MEDICAID

## 2023-12-11 ENCOUNTER — Ambulatory Visit: Admit: 2023-12-11 | Discharge: 2023-12-12 | Payer: MEDICAID

## 2023-12-11 DIAGNOSIS — M48061 Spinal stenosis, lumbar region without neurogenic claudication: Secondary | ICD-10-CM

## 2023-12-11 NOTE — Progress Notes
 Marc A. Veria, MD [facility]  Follow - Up Visit  Subjective     REASON FOR VISIT   Pain of the Lower Back and Pain, Numbness, and Tingling of the Left Leg    SUBJECTIVE     Returns today for hospital follow-up.  Continued left lateral hip pain.  Per her friend who is serving as her Nurse, learning disability, she remains pregnant at this time.       ROS: Review of Systems   Musculoskeletal:  Positive for arthralgias and back pain.   All other systems reviewed and are negative.    A 10-point ROS was performed and negative.    PHYSICAL EXAM   Blood pressure 111/59, pulse 60, height 165.1 cm (5' 5), weight 69.9 kg (154 lb 3.2 oz), last menstrual period 09/16/2023, SpO2 100%.  Body mass index is 25.66 kg/m?Taylor Palmer  Oswestry Total Score:: (Patient-Rptd) 30  Pain Score: Six    Constitutional: Alert, NAD  Head: Atraumatic  Eyes: EOMI  Respiratory: Unlabored breathing  Cardiovascular: Regular rate  Skin: No rashes or open wounds appreciated on back  Musculoskeletal: Strength stable  Neurologic: Sensation stable    Unchanged    RADIOGRAPHS     No new radiographs.         ASSESSMENT / PLAN     Taylor Palmer is a 43 y.o. female with     1. Spinal stenosis of lumbar region with radiculopathy  AMB REFERRAL TO SPINE CENTER      2. Stenosis of lateral recess of lumbar spine  AMB REFERRAL TO SPINE CENTER          There remains a question based on the clinical history as well as an ultrasound that was performed middle to end of June as to whether the patient is indeed still pregnant.  I think that this needs to be delineated clearly to help make decisions moving forward.    I have referred her to one of my colleagues in anesthesia pain to help with interventions.  If she is indeed not pregnant currently, she would be a candidate for transforaminal epidural injections based on her L4-5 stenosis.  I would target these injections at L4-5 and L5-S1 to hit the traversing L5 nerve root with a therapeutic dose.    I do not think that this is necessarily a surgical problem at this time.  She can follow-up as needed.    Portions of this record were generated with Dragon, a dictation software. There may be typos or grammatical errors. Please contact my office for any documentation clarifications.           Taylor Moxon B. Fernando, MD, MPH  Spinal Surgery  Taylor Palmer. Veria MD, [facility]  Nurse: Olam Danas, BSN, RN, CNOR   [phone]  -  [email or web address]

## 2023-12-18 ENCOUNTER — Encounter: Admit: 2023-12-18 | Discharge: 2023-12-18 | Payer: MEDICAID

## 2023-12-18 NOTE — Telephone Encounter
 I called the pt using Propio with the assistance of [facility] interpreter ID [id number]. We called the pt at [phone]. I was able to leave a voicemail for the pt in regards to their appointment on 12/24/2023, I informed them that they were needing to complete their questionnaire on MyChart prior to the appointment. If they were unable to complete the questionnaire prior then they will need to come in 45 minutes early at 1:45 to complete it during check in.

## 2023-12-21 ENCOUNTER — Encounter: Admit: 2023-12-21 | Discharge: 2023-12-21 | Payer: MEDICAID
